# Patient Record
Sex: Male | Born: 1990
Health system: Southern US, Community
[De-identification: ages and names within clinical notes are randomized; demographics above are authoritative.]

## PROBLEM LIST (undated history)

## (undated) DIAGNOSIS — I10 Essential (primary) hypertension: Principal | ICD-10-CM

## (undated) DIAGNOSIS — G4733 Obstructive sleep apnea (adult) (pediatric): Secondary | ICD-10-CM

## (undated) HISTORY — PX: TONSILLECTOMY: SUR1361

## (undated) HISTORY — DX: Obstructive sleep apnea (adult) (pediatric): G47.33

## (undated) HISTORY — DX: Essential (primary) hypertension: I10

---

## 2011-11-18 ENCOUNTER — Emergency Department (HOSPITAL_BASED_OUTPATIENT_CLINIC_OR_DEPARTMENT_OTHER)
Admission: EM | Admit: 2011-11-18 | Discharge: 2011-11-18 | Disposition: A | Discharge status: Home / Self Care | Payer: BC Managed Care – PPO | Attending: Emergency Medicine | Admitting: Emergency Medicine

## 2011-11-18 ENCOUNTER — Encounter (HOSPITAL_BASED_OUTPATIENT_CLINIC_OR_DEPARTMENT_OTHER): Payer: Self-pay | Admitting: *Deleted

## 2011-11-18 ENCOUNTER — Emergency Department (INDEPENDENT_AMBULATORY_CARE_PROVIDER_SITE_OTHER): Payer: BC Managed Care – PPO

## 2011-11-18 DIAGNOSIS — M25469 Effusion, unspecified knee: Secondary | ICD-10-CM

## 2011-11-18 DIAGNOSIS — I10 Essential (primary) hypertension: Secondary | ICD-10-CM | POA: Insufficient documentation

## 2011-11-18 DIAGNOSIS — W19XXXA Unspecified fall, initial encounter: Secondary | ICD-10-CM

## 2011-11-18 DIAGNOSIS — M25569 Pain in unspecified knee: Secondary | ICD-10-CM | POA: Insufficient documentation

## 2011-11-18 DIAGNOSIS — M25461 Effusion, right knee: Secondary | ICD-10-CM

## 2011-11-18 MED ORDER — IBUPROFEN 600 MG PO TABS: 600.0000 mg | ORAL_TABLET | Freq: Four times a day (QID) | ORAL | Status: AC | PRN

## 2011-11-18 MED ORDER — OXYCODONE-ACETAMINOPHEN 5-325 MG PO TABS
1.0000 | ORAL_TABLET | Freq: Once | ORAL | Status: AC
  Administered 2011-11-18: 1 via ORAL
  Filled 2011-11-18: qty 1

## 2011-11-18 MED ORDER — IBUPROFEN 800 MG PO TABS
800.0000 mg | ORAL_TABLET | Freq: Once | ORAL | Status: AC
  Administered 2011-11-18: 800 mg via ORAL
  Filled 2011-11-18: qty 1

## 2011-11-18 MED ORDER — OXYCODONE-ACETAMINOPHEN 5-325 MG PO TABS: 1.0000 | ORAL_TABLET | ORAL | Status: DC | PRN

## 2011-11-18 NOTE — ED Provider Notes (Signed)
History   This chart was scribed for Joya Gaskins, MD by Charolett Bumpers . The patient was seen in room MH07/MH07 and the patient's care was started at 10:16pm.    CSN: [id number]  Arrival date & time 11/18/11  1959   First MD Initiated Contact with Patient 11/18/11 2208      Chief Complaint  Patient presents with  . Knee Pain    HPI Roger Salinas is a 21 y.o. male who presents to the Emergency Department complaining of constant, moderate right knee pain that started after a fall that occurred yesterday. Patient states that he fell getting out of a shower. Patient reports hearing a popping noise upon falling on his knee. Patient also reports associated swelling of right knee. Patient denies fever and vomiting. Patient states that he is unable to bear weight on his right knee. Patient reports a h/o a similar problem, but did not have surgery on his right knee. Patient denies h/o other medical problems. No other symptoms reported.   PCP: [facility]  PMH - none  Past Surgical History  Procedure Date  . Tonsillectomy     History reviewed. No pertinent family history.  History  Substance Use Topics  . Smoking status: Current Everyday Smoker  . Smokeless tobacco: Not on file  . Alcohol Use: No      Review of Systems  Constitutional: Negative for fever.  Gastrointestinal: Negative for vomiting.     Allergies  Review of patient's allergies indicates no known allergies.  Home Medications   Current Outpatient Rx  Name Route Sig Dispense Refill  . IBUPROFEN 200 MG PO TABS Oral Take 800 mg by mouth 3 (three) times daily as needed. For pain      BP 162/108  Pulse 108  Temp(Src) 98.4 F (36.9 C) (Oral)  Resp 18  SpO2 96%  Physical Exam CONSTITUTIONAL: Well developed/well nourished HEAD AND FACE: Normocephalic/atraumatic EYES: EOMI/PERRL ENMT: Mucous membranes moist NECK: supple no meningeal signs SPINE:entire spine nontender CV: S1/S2 noted, no  murmurs/rubs/gallops noted LUNGS: Lungs are clear to auscultation bilaterally, no apparent distress ABDOMEN: soft, nontender, no rebound or guarding NEURO: Pt is awake/alert, moves all extremitiesx4 EXTREMITIES: pulses normal, full ROM. Diffuse tenderness of right knee with swelling noted. Limitations in flexion of right knee. No deformity.  All other extremities/joints palpated/ranged and nontender SKIN: warm, color normal PSYCH: no abnormalities of mood noted   ED Course  Procedures  DIAGNOSTIC STUDIES: Oxygen Saturation is 96% on room air, adequate by my interpretation.    COORDINATION OF CARE:  2219: Discussed planned course of treatment with the patient who was agreeable at this time. Discussed f/u.  2230: Medication Orders: Ibuprofen tablet 800 mg-once; Oxycodone-acetaminophen 5-325 mg per tablet 1 tablet-once   Labs Reviewed - No data to display Dg Knee Complete 4 Views Right  11/18/2011  *RADIOLOGY REPORT*  Clinical Data: Fall.  Knee pain.  RIGHT KNEE - COMPLETE 4+ VIEW  Comparison: None.  Findings: Suprapatellar joint effusion.  Minimal irregularity along the inferior aspect of the patella without discrete fracture identified.  IMPRESSION: Suprapatellar joint effusion.  Minimal irregularity along the inferior aspect of the patella without discrete fracture identified.  Original Report Authenticated By: Fuller Canada, M.D.    Pt advised NWB, crutches and close followup   MDM  Nursing notes reviewed and considered in documentation xrays reviewed and considered    I personally performed the services described in this documentation, which was scribed in my presence. The recorded  information has been reviewed and considered.         Joya Gaskins, MD 11/18/11 2337

## 2011-11-18 NOTE — ED Notes (Signed)
Pt states that he fell yesterday and heard and felt his right knee pop and felt it crack presents with significant swelling to that knee

## 2011-11-18 NOTE — Discharge Instructions (Signed)
KEEP LEG ELEVATED WHEN RESTING USE CRUTCHES UNTIL SEEN BY SPECIALIST RETURN FOR WORSENED PAIN OR SWELLING IN THAT LEG  Knee Effusion The medical term for having fluid in your knee is effusion. This is often due to an internal derangement of the knee. This means something is wrong inside the knee. Some of the causes of fluid in the knee may be torn cartilage, a torn ligament, or bleeding into the joint from an injury. Your knee is likely more difficult to bend and move. This is often because there is increased pain and pressure in the joint. The time it takes for recovery from a knee effusion depends on different factors, including:   Type of injury.   Your age.   Physical and medical conditions.   Rehabilitation Strategies.  How long you will be away from your normal activities will depend on what kind of knee problem you have and how much damage is present. Your knee has two types of cartilage. Articular cartilage covers the bone ends and lets your knee bend and move smoothly. Two menisci, thick pads of cartilage that form a rim inside the joint, help absorb shock and stabilize your knee. Ligaments bind the bones together and support your knee joint. Muscles move the joint, help support your knee, and take stress off the joint itself. CAUSES  Often an effusion in the knee is caused by an injury to one of the menisci. This is often a tear in the cartilage. Recovery after a meniscus injury depends on how much meniscus is damaged and whether you have damaged other knee tissue. Small tears may heal on their own with conservative treatment. Conservative means rest, limited weight bearing activity and muscle strengthening exercises. Your recovery may take up to 6 weeks.  TREATMENT  Larger tears may require surgery. Meniscus injuries may be treated during arthroscopy. Arthroscopy is a procedure in which your surgeon uses a small telescope like instrument to look in your knee. Your caregiver can make a  more accurate diagnosis (learning what is wrong) by performing an arthroscopic procedure. If your injury is on the inner margin of the meniscus, your surgeon may trim the meniscus back to a smooth rim. In other cases your surgeon will try to repair a damaged meniscus with stitches (sutures). This may make rehabilitation take longer, but may provide better long term result by helping your knee keep its shock absorption capabilities. Ligaments which are completely torn usually require surgery for repair. HOME CARE INSTRUCTIONS  Use crutches as instructed.   If a brace is applied, use as directed.   Once you are home, an ice pack applied to your swollen knee may help with discomfort and help decrease swelling.   Keep your knee raised (elevated) when you are not up and around or on crutches.   Only take over-the-counter or prescription medicines for pain, discomfort, or fever as directed by your caregiver.   Your caregivers will help with instructions for rehabilitation of your knee. This often includes strengthening exercises.   You may resume a normal diet and activities as directed.  SEEK MEDICAL CARE IF:   There is increased swelling in your knee.   You notice redness, swelling, or increasing pain in your knee.   An unexplained oral temperature above 102 F (38.9 C) develops.  SEEK IMMEDIATE MEDICAL CARE IF:   You develop a rash.   You have difficulty breathing.   You have any allergic reactions from medications you may have been given.  There is severe pain with any motion of the knee.  MAKE SURE YOU:   Understand these instructions.   Will watch your condition.   Will get help right away if you are not doing well or get worse.  Document Released: 10/20/2003 Document Revised: 07/19/2011 Document Reviewed: 12/24/2007 [facility] Patient Information 2012 [place], [place].

## 2011-11-18 NOTE — ED Notes (Signed)
rx x 1 for percocet given to pt- rx for ibuprofen was e-prescribed, pt is aware- d/c home with a ride

## 2011-11-21 ENCOUNTER — Ambulatory Visit (INDEPENDENT_AMBULATORY_CARE_PROVIDER_SITE_OTHER): Payer: BC Managed Care – PPO | Admitting: Family Medicine

## 2011-11-21 ENCOUNTER — Encounter: Payer: Self-pay | Admitting: Family Medicine

## 2011-11-21 ENCOUNTER — Ambulatory Visit: Payer: BC Managed Care – PPO | Admitting: Family Medicine

## 2011-11-21 VITALS — BP 149/102 | HR 92 | Temp 98.1°F | Ht 72.0 in | Wt 280.0 lb

## 2011-11-21 DIAGNOSIS — S99929A Unspecified injury of unspecified foot, initial encounter: Secondary | ICD-10-CM

## 2011-11-21 DIAGNOSIS — S8991XA Unspecified injury of right lower leg, initial encounter: Secondary | ICD-10-CM

## 2011-11-21 DIAGNOSIS — S8990XA Unspecified injury of unspecified lower leg, initial encounter: Secondary | ICD-10-CM

## 2011-11-21 NOTE — Patient Instructions (Signed)
Your exam and history are concerning for a medial meniscal tear and possible ACL tear. As you stated, this was a concern a few years ago and the MRI came back normal. I would recommend trying conservative treatment first. Ice knee 15 minutes at a time 3-4 times a day. Elevate above the level of your heart as much as possible. Quad sets and straight leg raises 3 sets of 10 once a day to keep quad strength. ACE wrap as directed to help with swelling. Aleve 1-2 tabs twice a day with food for pain and inflammation. Take pain medication as directed - no driving or working while on this medication. Follow up with me in 2-3 weeks. Out of work in the meantime.

## 2011-11-22 ENCOUNTER — Encounter: Payer: Self-pay | Admitting: Family Medicine

## 2011-11-22 DIAGNOSIS — S8991XA Unspecified injury of right lower leg, initial encounter: Secondary | ICD-10-CM | POA: Insufficient documentation

## 2011-11-22 MED ORDER — OXYCODONE-ACETAMINOPHEN 5-325 MG PO TABS: 1.0000 | ORAL_TABLET | Freq: Four times a day (QID) | ORAL | Status: AC | PRN

## 2011-11-22 NOTE — Progress Notes (Signed)
Subjective:    Patient ID: Roger Salinas, male    DOB: August 19, 1990, 21 y.o.   MRN: [id number]  PCP: None  HPI 21 yo M here for right knee injury.  Patient reports on 4/6 he was in shower when he slipped and fell down directly onto right knee after suffering valgus injury to this. States he heard a snap and a crack at the time. Was followed by swelling and bruising on inside of knee. Able to ambulate following this. Does feel improved since then. Went to ED - had x-rays without obvious fracture - placed in immobilizer and has been using crutches as well. Of note 2-3 years ago suffered a similar injury to right knee - had knee aspirated and MRI done that was negative for meniscal and ligament pathology.  He completely improved from this injury. Has been taking ibuprofen and pain medicine given to him by ED. Able to fully extend - has been in immobilizer so unsure about giving out, catching.  History reviewed. No pertinent past medical history.  Current Outpatient Prescriptions on File Prior to Visit  Medication Sig Dispense Refill  . ibuprofen (ADVIL,MOTRIN) 600 MG tablet Take 1 tablet (600 mg total) by mouth every 6 (six) hours as needed for pain.  30 tablet  0  . oxyCODONE-acetaminophen (PERCOCET) 5-325 MG per tablet Take 1 tablet by mouth every 4 (four) hours as needed for pain.  15 tablet  0    Past Surgical History  Procedure Date  . Tonsillectomy     No Known Allergies  History   Social History  . Marital Status: Single    Spouse Name: N/A    Number of Children: N/A  . Years of Education: N/A   Occupational History  . Not on file.   Social History Main Topics  . Smoking status: Current Everyday Smoker -- 1.0 packs/day    Types: Cigarettes  . Smokeless tobacco: Not on file  . Alcohol Use: No  . Drug Use: No  . Sexually Active: Not on file   Other Topics Concern  . Not on file   Social History Narrative  . No narrative on file    Family History  Problem  Relation Age of Onset  . Hypertension Mother   . Diabetes Neg Hx   . Heart attack Neg Hx   . Hyperlipidemia Neg Hx   . Sudden death Neg Hx     BP 149/102  Pulse 92  Temp(Src) 98.1 F (36.7 C) (Oral)  Ht 6' (1.829 m)  Wt 280 lb (127.007 kg)  BMI 37.97 kg/m2  Review of Systems See HPI above.    Objective:   Physical Exam Gen: NAD  R knee: Mod-large joint effusion.  Ecchymoses medially.  No erythema. TTP medial joint line.  No lateral joint line, patellar tendon, patella, or other TTP about knee. ROM 0 - 90 degrees. 1+ ant drawer and lachmanns.  Neg post drawer.  Negative valgus/varus testing.  Positive mcmurrays and apleys medially.  Pain with apprehension but does not feel as if patella will dislocate.   NV intact distally.  L knee: FROM without swelling, instability.    Assessment & Plan:  1. Right knee injury - history and exam concerning for medial meniscus tear and ACL tear.  Of note he had a similar injury with similar presentation 2-3 years ago and had a normal MRI, improved from this.  He would like to try conservative therapy first which I think is reasonable given his history,  lack of mechanical symptoms, and that operative intervention with ACL pathology typically is delayed a few weeks to decrease the risk of arthrofibrosis.  Start quad strengthening.  Icing, ace wrap, ibuprofen.  Rx percocet 5/325 q6h prn #60 today with no refills.  Offered aspiration but he declined - can consider in future to help with pain.  F/u in 2-3 weeks.  Out of work in the meantime due to being on pain medication, job requires lots of standing and ambulation.  See instructions for further.

## 2011-11-22 NOTE — Assessment & Plan Note (Signed)
history and exam concerning for medial meniscus tear and ACL tear.  Of note he had a similar injury with similar presentation 2-3 years ago and had a normal MRI, improved from this.  He would like to try conservative therapy first which I think is reasonable given his history, lack of mechanical symptoms, and that operative intervention with ACL pathology typically is delayed a few weeks to decrease the risk of arthrofibrosis.  Start quad strengthening.  Icing, ace wrap, ibuprofen.  Rx percocet 5/325 q6h prn #60 today with no refills.  Offered aspiration but he declined - can consider in future to help with pain.  F/u in 2-3 weeks.  Out of work in the meantime due to being on pain medication, job requires lots of standing and ambulation.  See instructions for further.

## 2011-12-05 ENCOUNTER — Encounter: Payer: Self-pay | Admitting: Family Medicine

## 2011-12-05 ENCOUNTER — Ambulatory Visit (INDEPENDENT_AMBULATORY_CARE_PROVIDER_SITE_OTHER): Payer: BC Managed Care – PPO | Admitting: Family Medicine

## 2011-12-05 VITALS — BP 130/88 | HR 96 | Temp 97.9°F | Ht 72.0 in | Wt 280.0 lb

## 2011-12-05 DIAGNOSIS — S8991XA Unspecified injury of right lower leg, initial encounter: Secondary | ICD-10-CM

## 2011-12-05 DIAGNOSIS — S8990XA Unspecified injury of unspecified lower leg, initial encounter: Secondary | ICD-10-CM

## 2011-12-05 MED ORDER — TRAMADOL HCL 50 MG PO TABS: 50.0000 mg | ORAL_TABLET | Freq: Three times a day (TID) | ORAL | Status: DC | PRN

## 2011-12-05 NOTE — Patient Instructions (Addendum)
Continue with your home exercises, icing, ibuprofen, ACE wrap. Take tramadol as needed for severe pain - I've sent this to the [facility] on [address]. Considerations moving forward: physical therapy, MRI, cortisone injection. Return to work on light duty: stand/walk only 10 minutes each hour, no stooping, crawling, climbing. Follow up with me in 4 weeks for reevaluation.

## 2011-12-05 NOTE — Progress Notes (Signed)
Subjective:    Patient ID: Roger Salinas, male    DOB: 1990-08-14, 21 y.o.   MRN: [id number]  PCP: None  HPI  21 yo M here for f/u right knee injury.  4/10: Patient reports on 4/6 he was in shower when he slipped and fell down directly onto right knee after suffering valgus injury to this. States he heard a snap and a crack at the time. Was followed by swelling and bruising on inside of knee. Able to ambulate following this. Does feel improved since then. Went to ED - had x-rays without obvious fracture - placed in immobilizer and has been using crutches as well. Of note 2-3 years ago suffered a similar injury to right knee - had knee aspirated and MRI done that was negative for meniscal and ligament pathology.  He completely improved from this injury. Has been taking ibuprofen and pain medicine given to him by ED. Able to fully extend - has been in immobilizer so unsure about giving out, catching.  4/24: Patient reports he's had improvement since last visit. Is doing home exercises regularly. Taking ibuprofen with percocet as needed. Has been icing knee and wearing ACE wrap as well. He would like to return to work if possible. He would like to also continue with conservative care. No catching, locking of knee.  History reviewed. No pertinent past medical history.  No current outpatient prescriptions on file prior to visit.    Past Surgical History  Procedure Date  . Tonsillectomy     No Known Allergies  History   Social History  . Marital Status: Single    Spouse Name: N/A    Number of Children: N/A  . Years of Education: N/A   Occupational History  . Not on file.   Social History Main Topics  . Smoking status: Current Everyday Smoker -- 1.0 packs/day    Types: Cigarettes  . Smokeless tobacco: Not on file  . Alcohol Use: No  . Drug Use: No  . Sexually Active: Not on file   Other Topics Concern  . Not on file   Social History Narrative  . No  narrative on file    Family History  Problem Relation Age of Onset  . Hypertension Mother   . Diabetes Neg Hx   . Heart attack Neg Hx   . Hyperlipidemia Neg Hx   . Sudden death Neg Hx     BP 130/88  Pulse 96  Temp(Src) 97.9 F (36.6 C) (Oral)  Ht 6' (1.829 m)  Wt 280 lb (127.007 kg)  BMI 37.97 kg/m2  Review of Systems  See HPI above.    Objective:   Physical Exam  Gen: NAD  R knee: Mod effusion.  Ecchymoses resolved.  No erythema. TTP medial joint line.  No lateral joint line, patellar tendon, patella, or other TTP about knee. ROM 0 - 100 degrees. Negative ant drawer and lachmanns.  Neg post drawer.  Negative valgus/varus testing.  Positive mcmurrays and apleys medially.  Pain with apprehension but does not feel as if patella will dislocate.   NV intact distally.  L knee: FROM without swelling, instability.    Assessment & Plan:  1. Right knee injury - ACL feels stable on today's exam - doubt injury to this though we discussed large effusions have a high incidence of ACL tears and exam can be falsely negative for these with hamstring guarding.  Still believe he has a medial meniscus tear.  He would like to continue with  conservative care though, exercises, icing, medications.  Back down to tramadol instead of percocet.  Return to work on light duty - note provided.  See instructions for further.  We discussed possibilities of physical therapy, aspiration, injection, further imaging and he declined these at this time.

## 2011-12-06 NOTE — Assessment & Plan Note (Signed)
ACL feels stable on today's exam - doubt injury to this though we discussed large effusions have a high incidence of ACL tears and exam can be falsely negative for these with hamstring guarding.  Still believe he has a medial meniscus tear.  He would like to continue with conservative care though, exercises, icing, medications.  Back down to tramadol instead of percocet.  Return to work on light duty - note provided.  See instructions for further.  We discussed possibilities of physical therapy, aspiration, injection, further imaging and he declined these at this time.

## 2012-01-02 ENCOUNTER — Ambulatory Visit (INDEPENDENT_AMBULATORY_CARE_PROVIDER_SITE_OTHER): Payer: BC Managed Care – PPO | Admitting: Family Medicine

## 2012-01-02 ENCOUNTER — Encounter: Payer: Self-pay | Admitting: Family Medicine

## 2012-01-02 VITALS — BP 152/100 | Ht 72.0 in | Wt 280.0 lb

## 2012-01-02 DIAGNOSIS — S8991XA Unspecified injury of right lower leg, initial encounter: Secondary | ICD-10-CM

## 2012-01-02 DIAGNOSIS — S8990XA Unspecified injury of unspecified lower leg, initial encounter: Secondary | ICD-10-CM

## 2012-01-03 ENCOUNTER — Encounter: Payer: Self-pay | Admitting: Family Medicine

## 2012-01-03 NOTE — Progress Notes (Signed)
  Subjective:    Patient ID: Roger Salinas, male    DOB: 08-20-1990, 21 y.o.   MRN: [id number]  PCP: None  HPI  21 yo M here for f/u right knee injury.  4/10: Patient reports on 4/6 he was in shower when he slipped and fell down directly onto right knee after suffering valgus injury to this. States he heard a snap and a crack at the time. Was followed by swelling and bruising on inside of knee. Able to ambulate following this. Does feel improved since then. Went to ED - had x-rays without obvious fracture - placed in immobilizer and has been using crutches as well. Of note 2-3 years ago suffered a similar injury to right knee - had knee aspirated and MRI done that was negative for meniscal and ligament pathology.  He completely improved from this injury. Has been taking ibuprofen and pain medicine given to him by ED. Able to fully extend - has been in immobilizer so unsure about giving out, catching.  4/24: Patient reports he's had improvement since last visit. Is doing home exercises regularly. Taking ibuprofen with percocet as needed. Has been icing knee and wearing ACE wrap as well. He would like to return to work if possible. He would like to also continue with conservative care. No catching, locking of knee.  5/22: Patient reports complete improvement. No catching, locking, giving out. No pain currently. Ready to return to work without restrictions. No complaints. History reviewed. No pertinent past medical history.  No current outpatient prescriptions on file prior to visit.    Past Surgical History  Procedure Date  . Tonsillectomy     No Known Allergies  History   Social History  . Marital Status: Single    Spouse Name: N/A    Number of Children: N/A  . Years of Education: N/A   Occupational History  . Not on file.   Social History Main Topics  . Smoking status: Current Everyday Smoker -- 1.0 packs/day    Types: Cigarettes  . Smokeless tobacco: Not  on file  . Alcohol Use: No  . Drug Use: No  . Sexually Active: Not on file   Other Topics Concern  . Not on file   Social History Narrative  . No narrative on file    Family History  Problem Relation Age of Onset  . Hypertension Mother   . Diabetes Neg Hx   . Heart attack Neg Hx   . Hyperlipidemia Neg Hx   . Sudden death Neg Hx     BP 152/100  Ht 6' (1.829 m)  Wt 280 lb (127.007 kg)  BMI 37.97 kg/m2  Review of Systems  See HPI above.    Objective:   Physical Exam  Gen: NAD  R knee: No gross deformity, ecchymoses, swelling. No TTP. FROM. Negative ant/post drawers. Negative valgus/varus testing. Negative lachmanns. Negative mcmurrays, apleys, patellar apprehension, clarkes. NV intact distally.  L knee: FROM without swelling, instability.    Assessment & Plan:  1. Right knee injury - clinically improved now without complaints, mechanical symptoms.  ACL stable.  Believe his injury and pain were due to a small medial meniscal tear that is now asymptomatic.  Continue with home exercises.  Return to work full duty without restrictions.  Icing, tylenol, nsaids as needed.  F/u prn.

## 2012-01-03 NOTE — Assessment & Plan Note (Signed)
clinically improved now without complaints, mechanical symptoms.  ACL stable.  Believe his injury and pain were due to a small medial meniscal tear that is now asymptomatic.  Continue with home exercises.  Return to work full duty without restrictions.  Icing, tylenol, nsaids as needed.  F/u prn.

## 2012-07-14 ENCOUNTER — Emergency Department
Admission: EM | Admit: 2012-07-14 | Discharge: 2012-07-14 | Disposition: A | Discharge status: Home / Self Care | Payer: BC Managed Care – PPO | Source: Home / Self Care | Attending: Family Medicine | Admitting: Family Medicine

## 2012-07-14 ENCOUNTER — Encounter: Payer: Self-pay | Admitting: Emergency Medicine

## 2012-07-14 ENCOUNTER — Emergency Department (INDEPENDENT_AMBULATORY_CARE_PROVIDER_SITE_OTHER): Payer: BC Managed Care – PPO

## 2012-07-14 DIAGNOSIS — M25469 Effusion, unspecified knee: Secondary | ICD-10-CM

## 2012-07-14 DIAGNOSIS — S8990XA Unspecified injury of unspecified lower leg, initial encounter: Secondary | ICD-10-CM

## 2012-07-14 DIAGNOSIS — S8991XA Unspecified injury of right lower leg, initial encounter: Secondary | ICD-10-CM

## 2012-07-14 DIAGNOSIS — M25569 Pain in unspecified knee: Secondary | ICD-10-CM

## 2012-07-14 DIAGNOSIS — X500XXA Overexertion from strenuous movement or load, initial encounter: Secondary | ICD-10-CM

## 2012-07-14 NOTE — ED Provider Notes (Signed)
History     CSN: [id number]  Arrival date & time 07/14/12  4098   First MD Initiated Contact with Patient 07/14/12 1007      Chief Complaint  Patient presents with  . Knee Injury      HPI Comments: Patient tripped over furniture this morning, twisting his right knee.  He felt two "popping" sensations, followed by swelling.  Knee occasionally feels as if it may give way or lock.  About 8 months ago he had a mild right meniscal tear that healed with conservative treatment.  Patient is a 21 y.o. male presenting with knee pain. The history is provided by the patient.  Knee Pain This is a new problem. The current episode started 1 to 2 hours ago. The problem occurs constantly. The problem has not changed since onset.Associated symptoms comments: none. The symptoms are aggravated by walking and bending. Nothing relieves the symptoms. He has tried nothing for the symptoms.    History reviewed. No pertinent past medical history.  Past Surgical History  Procedure Date  . Tonsillectomy     Family History  Problem Relation Age of Onset  . Hypertension Mother   . Diabetes Neg Hx   . Heart attack Neg Hx   . Hyperlipidemia Neg Hx   . Sudden death Neg Hx     History  Substance Use Topics  . Smoking status: Current Every Day Smoker -- 1.0 packs/day for 10 years    Types: Cigarettes  . Smokeless tobacco: Not on file  . Alcohol Use: Yes      Review of Systems  All other systems reviewed and are negative.    Allergies  Review of patient's allergies indicates not on file.  Home Medications  No current outpatient prescriptions on file.  BP 146/95  Pulse 102  Temp 98 F (36.7 C) (Oral)  Resp 20  Ht 6' (1.829 m)  Wt 321 lb (145.605 kg)  BMI 43.54 kg/m2  SpO2 96%  Physical Exam  Nursing note and vitals reviewed. Constitutional: He is oriented to person, place, and time. He appears well-developed and well-nourished. No distress.       Patient is obese (BMI 43.5)      HENT:  Head: Atraumatic.  Eyes: Conjunctivae normal are normal. Pupils are equal, round, and reactive to light.  Musculoskeletal: He exhibits tenderness.       Right knee: He exhibits decreased range of motion, swelling and effusion. He exhibits no ecchymosis, no deformity, no laceration, no erythema, normal alignment, no LCL laxity, normal patellar mobility, no bony tenderness, normal meniscus and no MCL laxity. tenderness found. No medial joint line, no lateral joint line, no MCL, no LCL and no patellar tendon tenderness noted.       Right knee has decreased range of motion to full flexion.  There is tenderness along the medial edge of patella.  Negative McMurray test.  Knee stable.  Distal Neurovascular function is intact.   Neurological: He is alert and oriented to person, place, and time.  Skin: Skin is warm and dry.    ED Course  Procedures  none   Dg Knee Complete 4 Views Right  07/14/2012  *RADIOLOGY REPORT*  Clinical Data: Fell twisting knee with pain and swelling  RIGHT KNEE - COMPLETE 4+ VIEW  Comparison: Right knee films of 11/18/2011  Findings: No acute fracture is seen.  There does appear to be a small knee joint effusion present.  Joint spaces are relatively well preserved.  IMPRESSION: No  fracture.  Small right knee joint effusion.   Original Report Authenticated By: Dwyane Dee, M.D.      1. Right knee injury; ? Patellar subluxation; ? Meniscus injury      MDM   Ace wrap applied. Wear ace wrap until swelling decreases.  Resume using knee immobilizer.  Apply ice pack for 30 to 45 minutes every 1 to 4 hours.  Continue until swelling decreases.  Take Ibuprofen 200mg , 4 tabs every 8 hours with food.  Begin knee exercises. Followup with Dr. Rodney Langton in one week.        Lattie Haw, MD 07/14/12 [phone]

## 2012-07-14 NOTE — ED Notes (Signed)
Rt knee injury, this morning from fall, heard several pops, hx of torn meniscus

## 2014-11-02 ENCOUNTER — Emergency Department (HOSPITAL_BASED_OUTPATIENT_CLINIC_OR_DEPARTMENT_OTHER)
Admission: EM | Admit: 2014-11-02 | Discharge: 2014-11-02 | Disposition: A | Discharge status: Home / Self Care | Payer: Self-pay | Attending: Emergency Medicine | Admitting: Emergency Medicine

## 2014-11-02 ENCOUNTER — Encounter (HOSPITAL_BASED_OUTPATIENT_CLINIC_OR_DEPARTMENT_OTHER): Payer: Self-pay | Admitting: *Deleted

## 2014-11-02 ENCOUNTER — Emergency Department (HOSPITAL_BASED_OUTPATIENT_CLINIC_OR_DEPARTMENT_OTHER): Payer: Self-pay

## 2014-11-02 DIAGNOSIS — R000 Tachycardia, unspecified: Secondary | ICD-10-CM | POA: Insufficient documentation

## 2014-11-02 DIAGNOSIS — R0602 Shortness of breath: Secondary | ICD-10-CM | POA: Insufficient documentation

## 2014-11-02 DIAGNOSIS — R030 Elevated blood-pressure reading, without diagnosis of hypertension: Secondary | ICD-10-CM | POA: Insufficient documentation

## 2014-11-02 DIAGNOSIS — R079 Chest pain, unspecified: Secondary | ICD-10-CM | POA: Insufficient documentation

## 2014-11-02 DIAGNOSIS — Z720 Tobacco use: Secondary | ICD-10-CM | POA: Insufficient documentation

## 2014-11-02 DIAGNOSIS — G5711 Meralgia paresthetica, right lower limb: Secondary | ICD-10-CM | POA: Insufficient documentation

## 2014-11-02 DIAGNOSIS — IMO0001 Reserved for inherently not codable concepts without codable children: Secondary | ICD-10-CM

## 2014-11-02 LAB — CBC
HCT: 41.6 % (ref 39.0–52.0)
Hemoglobin: 13.9 g/dL (ref 13.0–17.0)
MCH: 27.2 pg (ref 26.0–34.0)
MCHC: 33.4 g/dL (ref 30.0–36.0)
MCV: 81.4 fL (ref 78.0–100.0)
Platelets: 254 10*3/uL (ref 150–400)
RBC: 5.11 MIL/uL (ref 4.22–5.81)
RDW: 13.8 % (ref 11.5–15.5)
WBC: 10.6 10*3/uL — ABNORMAL HIGH (ref 4.0–10.5)

## 2014-11-02 LAB — BASIC METABOLIC PANEL
Anion gap: 8 (ref 5–15)
BUN: 16 mg/dL (ref 6–23)
CO2: 28 mmol/L (ref 19–32)
Calcium: 9.1 mg/dL (ref 8.4–10.5)
Chloride: 102 mmol/L (ref 96–112)
Creatinine, Ser: 1 mg/dL (ref 0.50–1.35)
GFR calc Af Amer: >90 mL/min (ref >90)
GFR calc non Af Amer: >90 mL/min (ref >90)
Glucose, Bld: 120 mg/dL — ABNORMAL HIGH (ref 70–99)
Potassium: 3.7 mmol/L (ref 3.5–5.1)
Sodium: 138 mmol/L (ref 135–145)

## 2014-11-02 LAB — D-DIMER, QUANTITATIVE: D-Dimer, Quant: 0.34 ug/mL-FEU (ref 0.00–0.48)

## 2014-11-02 LAB — TROPONIN I: Troponin I: 0.03 ng/mL (ref <0.031)

## 2014-11-02 LAB — BRAIN NATRIURETIC PEPTIDE: B Natriuretic Peptide: 42.3 pg/mL (ref 0.0–100.0)

## 2014-11-02 MED ORDER — METOPROLOL TARTRATE 25 MG PO TABS: 50.0000 mg | ORAL_TABLET | Freq: Two times a day (BID) | ORAL | Status: DC

## 2014-11-02 NOTE — ED Notes (Signed)
Pt c/o numbness to his lateral right thigh since yesterday. Pt ambulating unassisted with a quick, steady gait.

## 2014-11-02 NOTE — ED Provider Notes (Signed)
CSN: [id number]     Arrival date & time 11/02/14  1600 History   First MD Initiated Contact with Patient 11/02/14 1610     Chief Complaint  Patient presents with  . Numbness  . Shortness of Breath     (Consider location/radiation/quality/duration/timing/severity/associated sxs/prior Treatment) HPI Comments: 24 y/o morbidly obese M c/o numbness to right lateral thigh that he first noticed yesterday afternoon while at work. Denies pain, however has a tingling sensation when standing for a long period of time. Denies radiating pain. No back pain or prior injury. Denies extremity weakness. Denies exerting himself more than normal. Pt also endorses SOB on 2-3 occasions today described as needing to take in one deep breath then feel better, no previous history of the same. Admits to chest pain with inspiration along with palpation described as a "bruised" feeling in midsternum. No previous h/o DVTs, numbness/tingling, heart problems or diabetes. No recent travel or surgeries. No family hx of early heart disease. No home remedies. Denies back pain, numbness/tingling in hands/feet, lightheaded/dizziness, and blurred vision.   Patient is a 24 y.o. male presenting with shortness of breath. The history is provided by the patient.  Shortness of Breath Associated symptoms: chest pain     History reviewed. No pertinent past medical history. Past Surgical History  Procedure Laterality Date  . Tonsillectomy     Family History  Problem Relation Age of Onset  . Hypertension Mother   . Diabetes Neg Hx   . Heart attack Neg Hx   . Hyperlipidemia Neg Hx   . Sudden death Neg Hx    History  Substance Use Topics  . Smoking status: Current Every Day Smoker -- 1.00 packs/day for 10 years    Types: Cigarettes  . Smokeless tobacco: Not on file  . Alcohol Use: Yes    Review of Systems  Respiratory: Positive for shortness of breath.   Cardiovascular: Positive for chest pain.  Neurological: Positive for  numbness.  All other systems reviewed and are negative.     Allergies  Review of patient's allergies indicates no known allergies.  Home Medications   Prior to Admission medications   Medication Sig Start Date End Date Taking? Authorizing Provider  metoprolol (LOPRESSOR) 25 MG tablet Take 2 tablets (50 mg total) by mouth 2 (two) times daily. 11/02/14   Letia Guidry M Braian Tijerina, PA-C   BP 130/100 mmHg  Pulse 98  Temp(Src) 98.1 F (36.7 C) (Oral)  Resp 20  Ht 6' (1.829 m)  Wt 370 lb (167.831 kg)  BMI 50.17 kg/m2  SpO2 95% Physical Exam  Constitutional: He is oriented to person, place, and time. He appears well-developed and well-nourished. No distress.  Morbidly obese.  HENT:  Head: Normocephalic and atraumatic.  Mouth/Throat: Oropharynx is clear and moist.  Eyes: Conjunctivae and EOM are normal. Pupils are equal, round, and reactive to light.  Neck: Normal range of motion. Neck supple. No JVD present. No spinous process tenderness and no muscular tenderness present.  Cardiovascular: Normal rate, regular rhythm, normal heart sounds and intact distal pulses.   No extremity edema.  Pulmonary/Chest: Effort normal and breath sounds normal. No respiratory distress.    Abdominal: Soft. Bowel sounds are normal. There is no tenderness.  Musculoskeletal: Normal range of motion. He exhibits no edema.       Legs: Decreased sensation to light touch in area circled. No tenderness.  Neurological: He is alert and oriented to person, place, and time. He has normal strength.  Speech fluent, goal  oriented. Moves limbs without ataxia. Equal grip strength bilateral.  Skin: Skin is warm and dry. No rash noted. He is not diaphoretic.  Psychiatric: He has a normal mood and affect. His behavior is normal.  Nursing note and vitals reviewed.   ED Course  Procedures (including critical care time) Labs Review Labs Reviewed  CBC - Abnormal; Notable for the following:    WBC 10.6 (*)    All other  components within normal limits  BASIC METABOLIC PANEL - Abnormal; Notable for the following:    Glucose, Bld 120 (*)    All other components within normal limits  BRAIN NATRIURETIC PEPTIDE  D-DIMER, QUANTITATIVE  TROPONIN I    Imaging Review Dg Chest 2 View  11/02/2014   CLINICAL DATA:  Pleuritic chest pain. Shortness of breath. Duration: 1 day.  EXAM: CHEST  2 VIEW  COMPARISON:  None.  FINDINGS: The lungs appear clear.  Cardiac and mediastinal contours normal.  No pleural effusion identified.  IMPRESSION: No active cardiopulmonary disease.   Electronically Signed   By: Gaylyn Rong M.D.   On: 11/02/2014 17:48     EKG Interpretation   Date/Time:  Tuesday November 02 2014 17:00:54 EDT Ventricular Rate:  99 PR Interval:  158 QRS Duration: 94 QT Interval:  340 QTC Calculation: 436 R Axis:   15 Text Interpretation:  Normal sinus rhythm Normal ECG No old tracing to  compare Confirmed by Mirian Mo [phone]) on 11/02/2014 5:51:29 PM      MDM   Final diagnoses:  Meralgia paresthetica of right side  Shortness of breath  Elevated blood pressure   NAD. Afebrile. Tachycardic and hypertensive. No focal neurologic deficits other than numbness to his right lateral thigh. No associated back issues. Discussed NSAIDs and weight loss for meralgia paresthetica. Doubt CVA. Regarding shortness of breath, could not PERC negative given initial heart rate. Workup negative. Doubt CAD or CHF. Regarding elevated blood pressure, no history of the same. Repeat blood pressure still with diastolic 100. Will start patient on metoprolol, 25 mg twice a day. Resources given for PCP follow-up. Discussed importance of establishing care with a primary care physician. Stable for discharge. Return precautions given. Patient states understanding of treatment care plan and is agreeable.  Kathrynn Speed, PA-C 11/02/14 1824  Mirian Mo, MD 11/06/14 1728

## 2014-11-02 NOTE — Discharge Instructions (Signed)
Take metoprolol twice daily for your blood pressure. It is very important for you to establish care with a primary care doctor as soon as possible. Paresthesia Paresthesia is an abnormal burning or prickling sensation. This sensation is generally felt in the hands, arms, legs, or feet. However, it may occur in any part of the body. It is usually not painful. The feeling may be described as:  Tingling or numbness.  "Pins and needles."  Skin crawling.  Buzzing.  Limbs "falling asleep."  Itching. Most people experience temporary (transient) paresthesia at some time in their lives. CAUSES  Paresthesia may occur when you breathe too quickly (hyperventilation). It can also occur without any apparent cause. Commonly, paresthesia occurs when pressure is placed on a nerve. The feeling quickly goes away once the pressure is removed. For some people, however, paresthesia is a long-lasting (chronic) condition caused by an underlying disorder. The underlying disorder may be:  A traumatic, direct injury to nerves. Examples include a:  Broken (fractured) neck.  Fractured skull.  A disorder affecting the brain and spinal cord (central nervous system). Examples include:  Transverse myelitis.  Encephalitis.  Transient ischemic attack.  Multiple sclerosis.  Stroke.  Tumor or blood vessel problems, such as an arteriovenous malformation pressing against the brain or spinal cord.  A condition that damages the peripheral nerves (peripheral neuropathy). Peripheral nerves are not part of the brain and spinal cord. These conditions include:  Diabetes.  Peripheral vascular disease.  Nerve entrapment syndromes, such as carpal tunnel syndrome.  Shingles.  Hypothyroidism.  Vitamin B12 deficiencies.  Alcoholism.  Heavy metal poisoning (lead, arsenic).  Rheumatoid arthritis.  Systemic lupus erythematosus. DIAGNOSIS  Your caregiver will attempt to find the underlying cause of your  paresthesia. Your caregiver may:  Take your medical history.  Perform a physical exam.  Order various lab tests.  Order imaging tests. TREATMENT  Treatment for paresthesia depends on the underlying cause. HOME CARE INSTRUCTIONS  Avoid drinking alcohol.  You may consider massage or acupuncture to help relieve your symptoms.  Keep all follow-up appointments as directed by your caregiver. SEEK IMMEDIATE MEDICAL CARE IF:   You feel weak.  You have trouble walking or moving.  You have problems with speech or vision.  You feel confused.  You cannot control your bladder or bowel movements.  You feel numbness after an injury.  You faint.  Your burning or prickling feeling gets worse when walking.  You have pain, cramps, or dizziness.  You develop a rash. MAKE SURE YOU:  Understand these instructions.  Will watch your condition.  Will get help right away if you are not doing well or get worse. Document Released: 07/20/2002 Document Revised: 10/22/2011 Document Reviewed: 04/20/2011 [facility] Patient Information 2015 [place], [place]. This information is not intended to replace advice given to you by your health care provider. Make sure you discuss any questions you have with your health care provider.   Shortness of Breath Shortness of breath means you have trouble breathing. It could also mean that you have a medical problem. You should get immediate medical care for shortness of breath. CAUSES   Not enough oxygen in the air such as with high altitudes or a smoke-filled room.  Certain lung diseases, infections, or problems.  Heart disease or conditions, such as angina or heart failure.  Low red blood cells (anemia).  Poor physical fitness, which can cause shortness of breath when you exercise.  Chest or back injuries or stiffness.  Being overweight.  Smoking.  Anxiety, which can make you feel like you are not getting enough air. DIAGNOSIS  Serious medical  problems can often be found during your physical exam. Tests may also be done to determine why you are having shortness of breath. Tests may include:  Chest X-rays.  Lung function tests.  Blood tests.  An electrocardiogram (ECG).  An ambulatory electrocardiogram. An ambulatory ECG records your heartbeat patterns over a 24-hour period.  Exercise testing.  A transthoracic echocardiogram (TTE). During echocardiography, sound waves are used to evaluate how blood flows through your heart.  A transesophageal echocardiogram (TEE).  Imaging scans. Your health care provider may not be able to find a cause for your shortness of breath after your exam. In this case, it is important to have a follow-up exam with your health care provider as directed.  TREATMENT  Treatment for shortness of breath depends on the cause of your symptoms and can vary greatly. HOME CARE INSTRUCTIONS   Do not smoke. Smoking is a common cause of shortness of breath. If you smoke, ask for help to quit.  Avoid being around chemicals or things that may bother your breathing, such as paint fumes and dust.  Rest as needed. Slowly resume your usual activities.  If medicines were prescribed, take them as directed for the full length of time directed. This includes oxygen and any inhaled medicines.  Keep all follow-up appointments as directed by your health care provider. SEEK MEDICAL CARE IF:   Your condition does not improve in the time expected.  You have a hard time doing your normal activities even with rest.  You have any new symptoms. SEEK IMMEDIATE MEDICAL CARE IF:   Your shortness of breath gets worse.  You feel light-headed, faint, or develop a cough not controlled with medicines.  You start coughing up blood.  You have pain with breathing.  You have chest pain or pain in your arms, shoulders, or abdomen.  You have a fever.  You are unable to walk up stairs or exercise the way you normally  do. MAKE SURE YOU:  Understand these instructions.  Will watch your condition.  Will get help right away if you are not doing well or get worse. Document Released: 04/24/2001 Document Revised: 08/04/2013 Document Reviewed: 10/15/2011 [facility] Patient Information 2015 [place], [place]. This information is not intended to replace advice given to you by your health care provider. Make sure you discuss any questions you have with your health care provider.

## 2016-01-03 ENCOUNTER — Encounter (HOSPITAL_BASED_OUTPATIENT_CLINIC_OR_DEPARTMENT_OTHER): Payer: Self-pay | Admitting: *Deleted

## 2016-01-03 ENCOUNTER — Emergency Department (HOSPITAL_BASED_OUTPATIENT_CLINIC_OR_DEPARTMENT_OTHER)
Admission: EM | Admit: 2016-01-03 | Discharge: 2016-01-03 | Disposition: A | Discharge status: Home / Self Care | Payer: BLUE CROSS/BLUE SHIELD | Attending: Emergency Medicine | Admitting: Emergency Medicine

## 2016-01-03 DIAGNOSIS — F17210 Nicotine dependence, cigarettes, uncomplicated: Secondary | ICD-10-CM | POA: Diagnosis not present

## 2016-01-03 DIAGNOSIS — N390 Urinary tract infection, site not specified: Secondary | ICD-10-CM | POA: Insufficient documentation

## 2016-01-03 DIAGNOSIS — R300 Dysuria: Secondary | ICD-10-CM | POA: Diagnosis present

## 2016-01-03 LAB — URINE MICROSCOPIC-ADD ON

## 2016-01-03 LAB — URINALYSIS, ROUTINE W REFLEX MICROSCOPIC
Glucose, UA: NEGATIVE mg/dL
Ketones, ur: 15 mg/dL — AB
Nitrite: NEGATIVE
Protein, ur: 30 mg/dL — AB
Specific Gravity, Urine: 1.027 (ref 1.005–1.030)
pH: 5.5 (ref 5.0–8.0)

## 2016-01-03 MED ORDER — CEPHALEXIN 500 MG PO CAPS: 500.0000 mg | ORAL_CAPSULE | Freq: Three times a day (TID) | ORAL | Status: DC

## 2016-01-03 MED FILL — CEPHALEXIN 500 MG CAPSULE: 500 | 7 days supply | Qty: 21 | Fill #0

## 2016-01-03 NOTE — ED Notes (Signed)
Dysuria and urgency since this am.

## 2016-01-03 NOTE — Discharge Instructions (Signed)
There does not appear to be an emergent cause for your symptoms at this time. Her symptoms are likely due to a urinary tract infection. You'll be treated for this with antibiotics. It is important to follow-up with your PCP at the end of this week or next week to ensure the blood is out of your urine. If there is still blood in your urine, you may need further evaluation by a urologist. Return to ED for any new or worsening symptoms.  Urinary Tract Infection Urinary tract infections (UTIs) can develop anywhere along your urinary tract. Your urinary tract is your body's drainage system for removing wastes and extra water. Your urinary tract includes two kidneys, two ureters, a bladder, and a urethra. Your kidneys are a pair of bean-shaped organs. Each kidney is about the size of your fist. They are located below your ribs, one on each side of your spine. CAUSES Infections are caused by microbes, which are microscopic organisms, including fungi, viruses, and bacteria. These organisms are so small that they can only be seen through a microscope. Bacteria are the microbes that most commonly cause UTIs. SYMPTOMS  Symptoms of UTIs may vary by age and gender of the patient and by the location of the infection. Symptoms in young women typically include a frequent and intense urge to urinate and a painful, burning feeling in the bladder or urethra during urination. Older women and men are more likely to be tired, shaky, and weak and have muscle aches and abdominal pain. A fever may mean the infection is in your kidneys. Other symptoms of a kidney infection include pain in your back or sides below the ribs, nausea, and vomiting. DIAGNOSIS To diagnose a UTI, your caregiver will ask you about your symptoms. Your caregiver will also ask you to provide a urine sample. The urine sample will be tested for bacteria and white blood cells. White blood cells are made by your body to help fight infection. TREATMENT    Typically, UTIs can be treated with medication. Because most UTIs are caused by a bacterial infection, they usually can be treated with the use of antibiotics. The choice of antibiotic and length of treatment depend on your symptoms and the type of bacteria causing your infection. HOME CARE INSTRUCTIONS  If you were prescribed antibiotics, take them exactly as your caregiver instructs you. Finish the medication even if you feel better after you have only taken some of the medication.  Drink enough water and fluids to keep your urine clear or pale yellow.  Avoid caffeine, tea, and carbonated beverages. They tend to irritate your bladder.  Empty your bladder often. Avoid holding urine for long periods of time.  Empty your bladder before and after sexual intercourse.  After a bowel movement, women should cleanse from front to back. Use each tissue only once. SEEK MEDICAL CARE IF:   You have back pain.  You develop a fever.  Your symptoms do not begin to resolve within 3 days. SEEK IMMEDIATE MEDICAL CARE IF:   You have severe back pain or lower abdominal pain.  You develop chills.  You have nausea or vomiting.  You have continued burning or discomfort with urination. MAKE SURE YOU:   Understand these instructions.  Will watch your condition.  Will get help right away if you are not doing well or get worse.   This information is not intended to replace advice given to you by your health care provider. Make sure you discuss any questions you  have with your health care provider.   Document Released: 05/09/2005 Document Revised: 04/20/2015 Document Reviewed: 09/07/2011 [employer]vier Interactive Patient Education [employer].

## 2016-01-03 NOTE — ED Provider Notes (Signed)
CSN: [id number]     Arrival date & time 01/03/16  1339 History   First MD Initiated Contact with Patient 01/03/16 1403     Chief Complaint  Patient presents with  . Dysuria     (Consider location/radiation/quality/duration/timing/severity/associated sxs/prior Treatment) HPI Breyson Kelm is a 25 y.o. male here for evaluation of urinary hesitancy for the past one day-started at 7am. Patient reports he woke up this morning, felt like he needed to pee, "but only could get out through 4 drops" and still feels like he has to pee. He denies any back pain, numbness or weakness, rectal pain, penile pain or discharge, scrotal pain or testicular pain/swelling. Denies any medication use at this time. No recreational drug use, IV drug use. Reports last sexual contact with his wife on Friday, no concern for STI. Denies fevers, chills, abdominal pain, nausea or vomiting. No other alleviating or aggravating factors.  History reviewed. No pertinent past medical history. Past Surgical History  Procedure Laterality Date  . Tonsillectomy     Family History  Problem Relation Age of Onset  . Hypertension Mother   . Diabetes Neg Hx   . Heart attack Neg Hx   . Hyperlipidemia Neg Hx   . Sudden death Neg Hx    Social History  Substance Use Topics  . Smoking status: Current Every Day Smoker -- 1.00 packs/day for 10 years    Types: Cigarettes  . Smokeless tobacco: None  . Alcohol Use: Yes    Review of Systems A 10 point review of systems was completed and was negative except for pertinent positives and negatives as mentioned in the history of present illness     Allergies  Review of patient's allergies indicates no known allergies.  Home Medications   Prior to Admission medications   Medication Sig Start Date End Date Taking? Authorizing Provider  cephALEXin (KEFLEX) 500 MG capsule Take 1 capsule (500 mg total) by mouth 3 (three) times daily. 01/03/16   Joycie Peek, PA-C  metoprolol  (LOPRESSOR) 25 MG tablet Take 2 tablets (50 mg total) by mouth 2 (two) times daily. 11/02/14   Robyn M Hess, PA-C   BP 135/95 mmHg  Pulse 98  Temp(Src) 98.6 F (37 C) (Oral)  Resp 18  Ht 6' (1.829 m)  Wt 172.367 kg  BMI 51.53 kg/m2  SpO2 96% Physical Exam  Constitutional: He is oriented to person, place, and time. He appears well-developed and well-nourished.  Obese white male  HENT:  Head: Normocephalic and atraumatic.  Mouth/Throat: Oropharynx is clear and moist.  Eyes: Conjunctivae are normal. Pupils are equal, round, and reactive to light. Right eye exhibits no discharge. Left eye exhibits no discharge. No scleral icterus.  Neck: Neck supple.  Cardiovascular: Normal rate, regular rhythm and normal heart sounds.   Pulmonary/Chest: Effort normal and breath sounds normal. No respiratory distress. He has no wheezes. He has no rales.  Abdominal: Soft. He exhibits no distension and no mass. There is no tenderness. There is no rebound and no guarding.  Large panniculus  Musculoskeletal: He exhibits no tenderness.  Neurological: He is alert and oriented to person, place, and time.  Cranial Nerves II-XII grossly intact  Skin: Skin is warm and dry. No rash noted.  Psychiatric: He has a normal mood and affect.  Nursing note and vitals reviewed.   ED Course  Procedures (including critical care time) Labs Review Labs Reviewed  URINALYSIS, ROUTINE W REFLEX MICROSCOPIC (NOT AT [facility]) - Abnormal; Notable for the following:  Color, Urine AMBER (*)    APPearance CLOUDY (*)    Hgb urine dipstick LARGE (*)    Bilirubin Urine SMALL (*)    Ketones, ur 15 (*)    Protein, ur 30 (*)    Leukocytes, UA SMALL (*)    All other components within normal limits  URINE MICROSCOPIC-ADD ON - Abnormal; Notable for the following:    Squamous Epithelial / LPF 6-30 (*)    Bacteria, UA MANY (*)    Crystals CA OXALATE CRYSTALS (*)    All other components within normal limits  URINE CULTURE    Imaging  Review No results found. I have personally reviewed and evaluated these images and lab results as part of my medical decision-making.   EKG Interpretation None     Meds given in ED:  Medications - No data to display  New Prescriptions   CEPHALEXIN (KEFLEX) 500 MG CAPSULE    Take 1 capsule (500 mg total) by mouth 3 (three) times daily.   Filed Vitals:   01/03/16 1348  BP: 135/95  Pulse: 98  Temp: 98.6 F (37 C)  TempSrc: Oral  Resp: 18  Height: 6' (1.829 m)  Weight: 172.367 kg  SpO2: 96%    MDM  Tonny Bollmanimothy Seyler is a 25 y.o. male here for evaluation of urinary hesitancy since 7:00 this morning. Patient is an obese male, otherwise unremarkable physical exam, benign abdominal exam. Bedside ultrasound shows less than 30 mL in the bladder, doubt obstruction. Screening urinalysis with large hemoglobin, many bacteria, concerning for hemorrhagic cystitis. Urine culture obtained. Started on Keflex. Discussed follow-up with PCP in the next 3 days for reevaluation. Also discussed need for urological evaluation if symptoms do not improve or worsen. Referral given. Overall, appears well, nontoxic, hemodynamically stable and afebrile and appropriate for outpatient follow-up. Prior to patient discharge, I discussed and reviewed this case with Dr.Knott    Final diagnoses:  UTI (lower urinary tract infection)       Joycie PeekBenjamin Pa Tennant, PA-C 01/03/16 1530  Lyndal Pulleyaniel Knott, MD 01/03/16 2149

## 2016-01-05 LAB — URINE CULTURE

## 2016-04-21 IMAGING — CR DG CHEST 2V
2 series · 2 of 2 positions shown · non-contrast
Comparison: None.

CLINICAL DATA: Pleuritic chest pain. Shortness of breath. Duration:
1 day.

EXAM:
CHEST  2 VIEW

[w chest pa]
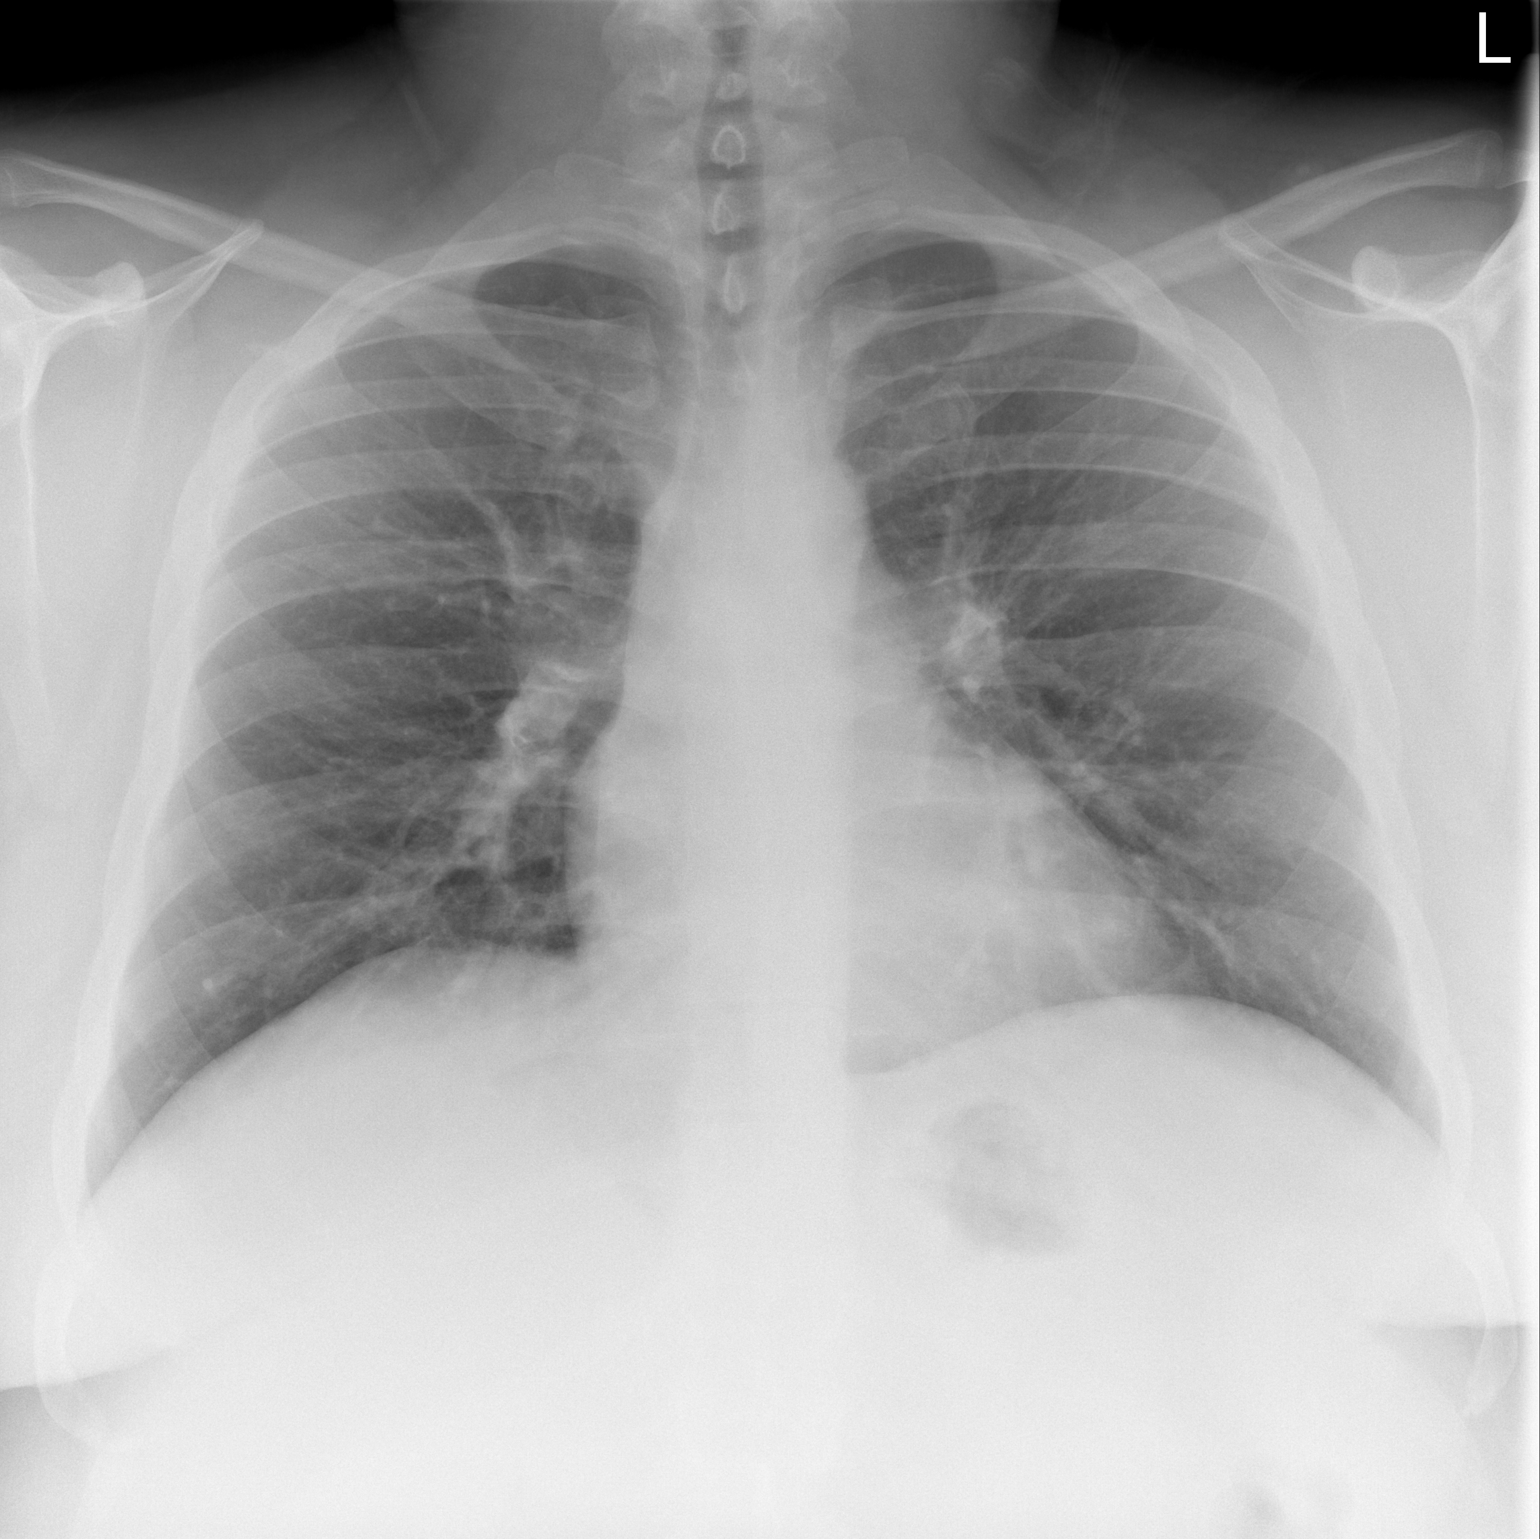

[w chest lat]
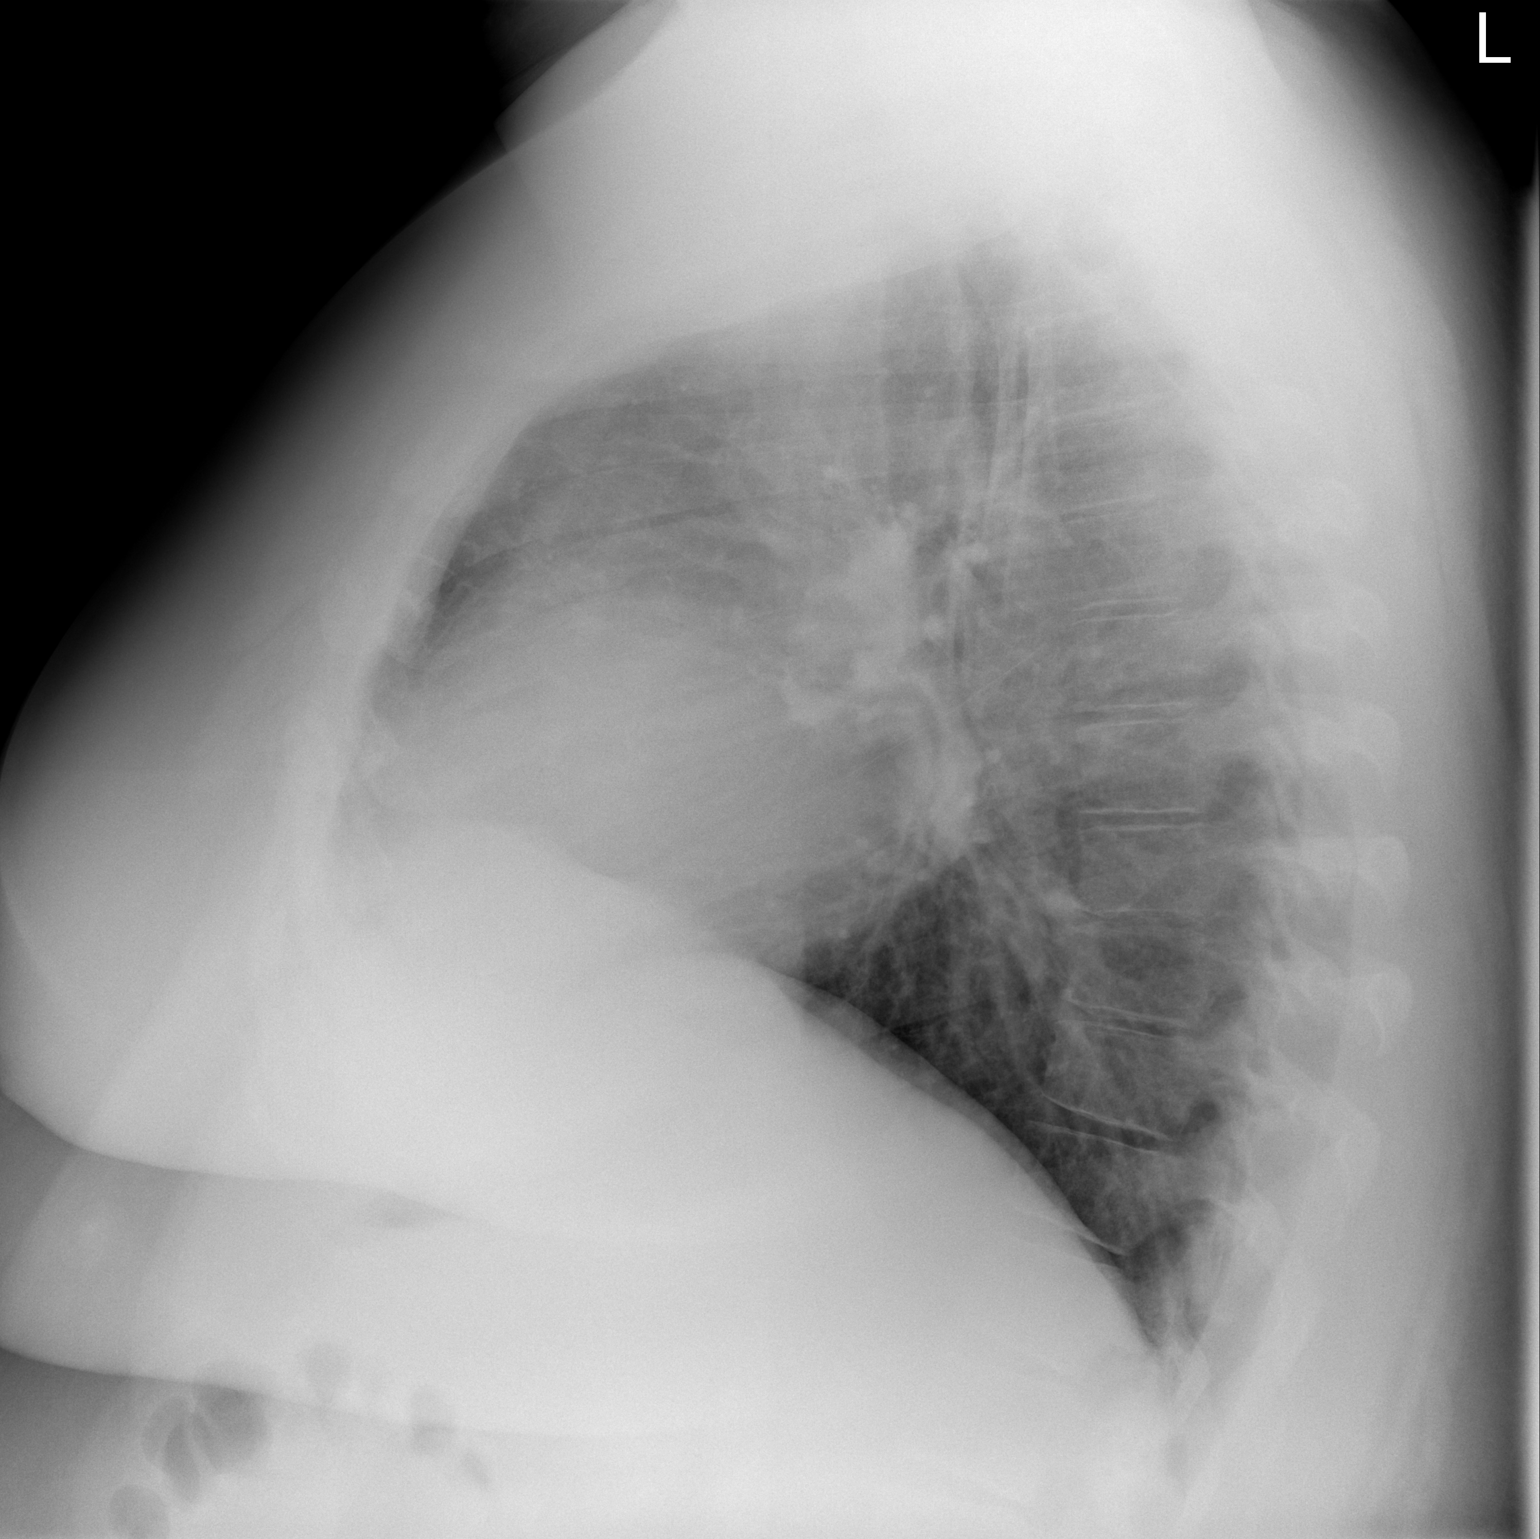

[2 of 2 positions shown; findings below may reference images not displayed]

FINDINGS: The lungs appear clear.  Cardiac and mediastinal contours normal.

No pleural effusion identified.
IMPRESSION: No active cardiopulmonary disease.

## 2016-09-01 ENCOUNTER — Emergency Department (HOSPITAL_COMMUNITY): Payer: BLUE CROSS/BLUE SHIELD

## 2016-09-01 ENCOUNTER — Observation Stay (HOSPITAL_COMMUNITY)
Admission: EM | Admit: 2016-09-01 | Discharge: 2016-09-02 | Disposition: A | Discharge status: Home / Self Care | Payer: BLUE CROSS/BLUE SHIELD | Attending: Internal Medicine | Admitting: Internal Medicine

## 2016-09-01 ENCOUNTER — Encounter (HOSPITAL_COMMUNITY): Payer: Self-pay

## 2016-09-01 DIAGNOSIS — E876 Hypokalemia: Secondary | ICD-10-CM | POA: Diagnosis present

## 2016-09-01 DIAGNOSIS — Z6841 Body Mass Index (BMI) 40.0 and over, adult: Secondary | ICD-10-CM | POA: Insufficient documentation

## 2016-09-01 DIAGNOSIS — I499 Cardiac arrhythmia, unspecified: Secondary | ICD-10-CM

## 2016-09-01 DIAGNOSIS — F17210 Nicotine dependence, cigarettes, uncomplicated: Secondary | ICD-10-CM | POA: Insufficient documentation

## 2016-09-01 DIAGNOSIS — E860 Dehydration: Secondary | ICD-10-CM | POA: Diagnosis not present

## 2016-09-01 DIAGNOSIS — J189 Pneumonia, unspecified organism: Secondary | ICD-10-CM | POA: Diagnosis not present

## 2016-09-01 DIAGNOSIS — R55 Syncope and collapse: Principal | ICD-10-CM | POA: Insufficient documentation

## 2016-09-01 DIAGNOSIS — E871 Hypo-osmolality and hyponatremia: Secondary | ICD-10-CM | POA: Insufficient documentation

## 2016-09-01 DIAGNOSIS — J101 Influenza due to other identified influenza virus with other respiratory manifestations: Secondary | ICD-10-CM | POA: Insufficient documentation

## 2016-09-01 DIAGNOSIS — R404 Transient alteration of awareness: Secondary | ICD-10-CM | POA: Diagnosis not present

## 2016-09-01 DIAGNOSIS — I498 Other specified cardiac arrhythmias: Secondary | ICD-10-CM | POA: Diagnosis present

## 2016-09-01 DIAGNOSIS — J1100 Influenza due to unidentified influenza virus with unspecified type of pneumonia: Secondary | ICD-10-CM | POA: Diagnosis not present

## 2016-09-01 DIAGNOSIS — R008 Other abnormalities of heart beat: Secondary | ICD-10-CM | POA: Insufficient documentation

## 2016-09-01 LAB — COMPREHENSIVE METABOLIC PANEL
ALT: 30 U/L (ref 17–63)
AST: 25 U/L (ref 15–41)
Albumin: 3.6 g/dL (ref 3.5–5.0)
Alkaline Phosphatase: 101 U/L (ref 38–126)
Anion gap: 11 (ref 5–15)
BUN: 9 mg/dL (ref 6–20)
CO2: 22 mmol/L (ref 22–32)
Calcium: 8.8 mg/dL — ABNORMAL LOW (ref 8.9–10.3)
Chloride: 100 mmol/L — ABNORMAL LOW (ref 101–111)
Creatinine, Ser: 0.89 mg/dL (ref 0.61–1.24)
GFR calc Af Amer: >60 mL/min (ref >60)
GFR calc non Af Amer: >60 mL/min (ref >60)
Glucose, Bld: 126 mg/dL — ABNORMAL HIGH (ref 65–99)
Potassium: 3.4 mmol/L — ABNORMAL LOW (ref 3.5–5.1)
Sodium: 133 mmol/L — ABNORMAL LOW (ref 135–145)
Total Bilirubin: 0.6 mg/dL (ref 0.3–1.2)
Total Protein: 6.9 g/dL (ref 6.5–8.1)

## 2016-09-01 LAB — URINALYSIS, ROUTINE W REFLEX MICROSCOPIC
Bacteria, UA: NONE SEEN
Bilirubin Urine: NEGATIVE
Glucose, UA: NEGATIVE mg/dL
Ketones, ur: NEGATIVE mg/dL
Leukocytes, UA: NEGATIVE
Nitrite: NEGATIVE
Protein, ur: 30 mg/dL — AB
Specific Gravity, Urine: 1.016 (ref 1.005–1.030)
pH: 5 (ref 5.0–8.0)

## 2016-09-01 LAB — TSH: TSH: 1.577 u[IU]/mL (ref 0.350–4.500)

## 2016-09-01 LAB — CBC
HCT: 42.6 % (ref 39.0–52.0)
Hemoglobin: 14.3 g/dL (ref 13.0–17.0)
MCH: 26.7 pg (ref 26.0–34.0)
MCHC: 33.6 g/dL (ref 30.0–36.0)
MCV: 79.5 fL (ref 78.0–100.0)
Platelets: 200 10*3/uL (ref 150–400)
RBC: 5.36 MIL/uL (ref 4.22–5.81)
RDW: 14.2 % (ref 11.5–15.5)
WBC: 9.5 10*3/uL (ref 4.0–10.5)

## 2016-09-01 LAB — PHOSPHORUS: Phosphorus: 3.5 mg/dL (ref 2.5–4.6)

## 2016-09-01 LAB — RAPID URINE DRUG SCREEN, HOSP PERFORMED
Amphetamines: NOT DETECTED
Barbiturates: NOT DETECTED
Benzodiazepines: NOT DETECTED
Cocaine: NOT DETECTED
Opiates: NOT DETECTED
Tetrahydrocannabinol: NOT DETECTED

## 2016-09-01 LAB — TROPONIN I: Troponin I: <0.03 ng/mL (ref <0.03)

## 2016-09-01 LAB — MAGNESIUM: Magnesium: 1.8 mg/dL (ref 1.7–2.4)

## 2016-09-01 LAB — I-STAT TROPONIN, ED: Troponin i, poc: 0.01 ng/mL (ref 0.00–0.08)

## 2016-09-01 LAB — BRAIN NATRIURETIC PEPTIDE: B Natriuretic Peptide: 32.6 pg/mL (ref 0.0–100.0)

## 2016-09-01 MED ORDER — SODIUM CHLORIDE 0.9 % IV SOLN
INTRAVENOUS | Status: DC
  Administered 2016-09-01: 75 mL/h via INTRAVENOUS
  Administered 2016-09-01: 19:00:00 via INTRAVENOUS

## 2016-09-01 MED ORDER — POTASSIUM CHLORIDE CRYS ER 20 MEQ PO TBCR
40.0000 meq | EXTENDED_RELEASE_TABLET | Freq: Once | ORAL | Status: AC
  Administered 2016-09-01: 40 meq via ORAL
  Filled 2016-09-01: qty 2

## 2016-09-01 MED ORDER — SODIUM CHLORIDE 0.9 % IV SOLN
1.0000 g | Freq: Once | INTRAVENOUS | Status: AC
  Administered 2016-09-01: 1 g via INTRAVENOUS
  Filled 2016-09-01: qty 10

## 2016-09-01 MED ORDER — SENNOSIDES-DOCUSATE SODIUM 8.6-50 MG PO TABS: 1.0000 | ORAL_TABLET | Freq: Every evening | ORAL | Status: DC | PRN

## 2016-09-01 MED ORDER — ENOXAPARIN SODIUM 40 MG/0.4ML ~~LOC~~ SOLN
40.0000 mg | SUBCUTANEOUS | Status: DC
  Filled 2016-09-01: qty 0.4

## 2016-09-01 MED ORDER — ENOXAPARIN SODIUM 100 MG/ML ~~LOC~~ SOLN
85.0000 mg | SUBCUTANEOUS | Status: DC
  Filled 2016-09-01: qty 1

## 2016-09-01 MED ORDER — ONDANSETRON HCL 4 MG PO TABS: 4.0000 mg | ORAL_TABLET | Freq: Four times a day (QID) | ORAL | Status: DC | PRN

## 2016-09-01 MED ORDER — SODIUM CHLORIDE 0.9% FLUSH
3.0000 mL | Freq: Two times a day (BID) | INTRAVENOUS | Status: DC
  Administered 2016-09-01 – 2016-09-02 (×2): 3 mL via INTRAVENOUS

## 2016-09-01 MED ORDER — HYDROMORPHONE HCL 2 MG/ML IJ SOLN: 0.5000 mg | INTRAMUSCULAR | Status: DC | PRN

## 2016-09-01 MED ORDER — ACETAMINOPHEN 325 MG PO TABS: 650.0000 mg | ORAL_TABLET | Freq: Four times a day (QID) | ORAL | Status: DC | PRN

## 2016-09-01 MED ORDER — ACETAMINOPHEN 650 MG RE SUPP: 650.0000 mg | Freq: Four times a day (QID) | RECTAL | Status: DC | PRN

## 2016-09-01 MED ORDER — SODIUM CHLORIDE 0.9 % IV BOLUS (SEPSIS)
1000.0000 mL | Freq: Once | INTRAVENOUS | Status: AC
  Administered 2016-09-01: 1000 mL via INTRAVENOUS

## 2016-09-01 MED ORDER — HYDROMORPHONE HCL 1 MG/ML IJ SOLN: 0.5000 mg | INTRAMUSCULAR | Status: DC | PRN

## 2016-09-01 MED ORDER — HYDROCODONE-ACETAMINOPHEN 5-325 MG PO TABS
1.0000 | ORAL_TABLET | ORAL | Status: DC | PRN
  Administered 2016-09-02: 1 via ORAL
  Filled 2016-09-01: qty 1

## 2016-09-01 MED ORDER — ONDANSETRON HCL 4 MG/2ML IJ SOLN: 4.0000 mg | Freq: Four times a day (QID) | INTRAMUSCULAR | Status: DC | PRN

## 2016-09-01 NOTE — Progress Notes (Signed)
Pt received from ED. Pt and mother oriented to room and equipment. VSS. Telemetry applied, CCMD notified x2. Call bell within reach, will continue to monitor.   Leonidas Rombergaitlin S Bumbledare, RN

## 2016-09-01 NOTE — ED Provider Notes (Addendum)
[facility]-EMERGENCY DEPT Provider Note   CSN: [id number] Arrival date & time: 09/01/16  1438     History   Chief Complaint Chief Complaint  Patient presents with  . Near Syncope  . Cough    HPI Roger Salinas is a 26 y.o. male.  HPI   26 yo M with no significant PMHx here with near syncopal episode. Pt states that over the past 3-4 days he has had mild cough, congestion, and one day of fever. He has been taking dayquil and nyquil frequently. Earlier today, he began feeling more tired than usual but was able to go to work. At work, he sat down to have a bowel movement. When he stood up, he began to experience significant lightheadedness, dizziness, and a feeling that he was going to pass out. He sat down and his sx slowly resolved over several minutes. He called EMS who brought him here. Currently he "feels fine." Denies any palpitations. EKG in triage showed bigeminy.  History reviewed. No pertinent past medical history.  Patient Active Problem List   Diagnosis Date Noted  . Near syncope 09/01/2016  . Bigeminal rhythm 09/01/2016  . Hypokalemia 09/01/2016  . Hyponatremia 09/01/2016  . Bigeminy 09/01/2016  . Right knee injury 11/22/2011    Past Surgical History:  Procedure Laterality Date  . TONSILLECTOMY         Home Medications    Prior to Admission medications   Medication Sig Start Date End Date Taking? Authorizing Provider  cephALEXin (KEFLEX) 500 MG capsule Take 1 capsule (500 mg total) by mouth 3 (three) times daily. 01/03/16   Joycie PeekBenjamin Cartner, PA-C  metoprolol (LOPRESSOR) 25 MG tablet Take 2 tablets (50 mg total) by mouth 2 (two) times daily. 11/02/14   Kathrynn Speedobyn M Hess, PA-C    Family History Family History  Problem Relation Age of Onset  . Hypertension Mother   . Diabetes Neg Hx   . Heart attack Neg Hx   . Hyperlipidemia Neg Hx   . Sudden death Neg Hx     Social History Social History  Substance Use Topics  . Smoking status: Current Every Day Smoker   Packs/day: 1.00    Years: 10.00    Types: Cigarettes  . Smokeless tobacco: Never Used  . Alcohol use Yes     Allergies   Patient has no known allergies.   Review of Systems Review of Systems  Constitutional: Positive for fatigue. Negative for chills and fever.  HENT: Positive for congestion and rhinorrhea.   Eyes: Negative for visual disturbance.  Respiratory: Positive for cough. Negative for shortness of breath and wheezing.   Cardiovascular: Negative for chest pain and leg swelling.  Gastrointestinal: Negative for abdominal pain, diarrhea, nausea and vomiting.  Genitourinary: Negative for dysuria and flank pain.  Musculoskeletal: Negative for neck pain and neck stiffness.  Skin: Negative for rash and wound.  Allergic/Immunologic: Negative for immunocompromised state.  Neurological: Negative for syncope, weakness and headaches.  All other systems reviewed and are negative.    Physical Exam Updated Vital Signs BP 132/62   Pulse (!) 51   Resp 19   Ht 6' (1.829 m)   Wt (!) 375 lb (170.1 kg)   SpO2 96%   BMI 50.86 kg/m   Physical Exam  Constitutional: He is oriented to person, place, and time. He appears well-developed and well-nourished. No distress.  HENT:  Head: Normocephalic and atraumatic.  Mouth/Throat: Oropharynx is clear and moist. No oropharyngeal exudate.  Eyes: Conjunctivae are normal.  Neck: Neck supple.  Cardiovascular: S1 normal, S2 normal, normal heart sounds and normal pulses.  Frequent extrasystoles are present. Tachycardia present.  Exam reveals no friction rub.   No murmur heard. Pulmonary/Chest: Effort normal and breath sounds normal. No respiratory distress. He has no wheezes. He has no rales.  Abdominal: He exhibits no distension.  Musculoskeletal: He exhibits no edema.  Neurological: He is alert and oriented to person, place, and time. He exhibits normal muscle tone.  Skin: Skin is warm. Capillary refill takes less than 2 seconds.    Psychiatric: He has a normal mood and affect.  Nursing note and vitals reviewed.    ED Treatments / Results  Labs (all labs ordered are listed, but only abnormal results are displayed) Labs Reviewed  COMPREHENSIVE METABOLIC PANEL - Abnormal; Notable for the following:       Result Value   Sodium 133 (*)    Potassium 3.4 (*)    Chloride 100 (*)    Glucose, Bld 126 (*)    Calcium 8.8 (*)    All other components within normal limits  CBC  MAGNESIUM  PHOSPHORUS  TSH  BRAIN NATRIURETIC PEPTIDE  I-STAT TROPOININ, ED    EKG  EKG Interpretation  Date/Time:  Saturday September 01 2016 14:49:53 EST Ventricular Rate:  113 PR Interval:    QRS Duration: 104 QT Interval:  344 QTC Calculation: 407 R Axis:   46 Text Interpretation:  Sinus tachycardia Ventricular bigeminy Since last tracing, Bigeminy is new Confirmed by Keyton Bhat MD, Killian Ress [phone]) on 09/01/2016 3:03:37 PM       Radiology Dg Chest Portable 1 View  Result Date: 09/01/2016 CLINICAL DATA:  Near syncope EXAM: PORTABLE CHEST 1 VIEW COMPARISON:  11/02/2014 FINDINGS: The heart is borderline enlarged. Lungs are clear. No pneumothorax or pleural effusion. IMPRESSION: No active disease. Electronically Signed   By: Jolaine Click M.D.   On: 09/01/2016 15:13    Procedures Procedures (including critical care time)  Medications Ordered in ED Medications  sodium chloride 0.9 % bolus 1,000 mL (1,000 mLs Intravenous New Bag/Given 09/01/16 1538)  potassium chloride SA (K-DUR,KLOR-CON) CR tablet 40 mEq (not administered)  calcium gluconate 1 g in sodium chloride 0.9 % 100 mL IVPB (not administered)     Initial Impression / Assessment and Plan / ED Course  I have reviewed the triage vital signs and the nursing notes.  Pertinent labs & imaging results that were available during my care of the patient were reviewed by me and considered in my medical decision making (see chart for details).     26 yo M with PMHx obesity here with  presyncopal episode in setting of likely viral URI x 1 week. On arrival, pt tachycardic, with EKG showing Bigeminy. BP normal. Telemetry shows intermittent bigeminy, frequent PVCs. Concern that pt's sx were 2/2 transient arrhythmia, possibly VTach, given his marked ectopy on monitor. DDx also includes orthostasis. Etiology for ectopy unclear - may be 2/2 electrolyte abnormality, illness, or possibly medication use. Will keep on monitor, check labs. CXR clear.  Labs show mild hypokalemia, hypocalcemia. Will replete. Pt no longer in bigeminy but with frequent PVCs - admit to obs.  Final Clinical Impressions(s) / ED Diagnoses   Final diagnoses:  Ventricular bigeminy  Hypokalemia  Hypocalcemia    New Prescriptions New Prescriptions   No medications on file     Shaune Pollack, MD 09/01/16 1614    Shaune Pollack, MD 09/01/16 [phone]

## 2016-09-01 NOTE — H&P (Signed)
History and Physical    Nader Boys GLO:[id number] DOB: Apr 18, 1991 DOA: 09/01/2016  PCP: No PCP Per Patient Patient coming from: home  Chief Complaint: near syncope/cough  HPI: Roger Salinas is a 26 y.o. male with medical history significant for ob presents to the emergency Department chief complaint of near syncope. Initial evaluation reveals bigeminal rhythm hyponatremia hypokalemia.  Information is obtained from the patient. He states for the last 3-4 days he has suffered from sinus congestion pulmonary congestion/cough chills subjective fever. He states he started taking NyQuill and DayQuil yesterday. This morning while at work and after [facility] he experienced significant lightheadedness dizziness and was afraid he was going to pass out. He sat down quickly and his symptoms resolved. He denies headache visual disturbances chest pain palpitation shortness of breath. He denies diaphoresis nausea vomiting. He denies dysuria hematuria frequency or urgency. He denies diarrhea constipation melena bright red blood per rectum He states he has been eating and drinking his normal amount but has been sleeping a little more than usual.    ED Course: He is afebrile hemodynamically stable and not hypoxic. He is provided with IV fluids  Review of Systems: As per HPI otherwise 10 point review of systems negative.   Ambulatory Status: He relates independently  History reviewed. No pertinent past medical history.  Past Surgical History:  Procedure Laterality Date  . TONSILLECTOMY      Social History   Social History  . Marital status: Married    Spouse name: N/A  . Number of children: N/A  . Years of education: N/A   Occupational History  . Not on file.   Social History Main Topics  . Smoking status: Current Every Day Smoker    Packs/day: 1.00    Years: 10.00    Types: Cigarettes  . Smokeless tobacco: Never Used  . Alcohol use Yes  . Drug use: No  . Sexual activity: Not on file    Other Topics Concern  . Not on file   Social History Narrative  . No narrative on file  Lives at home with his wife. He works at a [employer].  No Known Allergies  Family History  Problem Relation Age of Onset  . Hypertension Mother   . Diabetes Neg Hx   . Heart attack Neg Hx   . Hyperlipidemia Neg Hx   . Sudden death Neg Hx     Prior to Admission medications   Medication Sig Start Date End Date Taking? Authorizing Provider  ibuprofen (ADVIL,MOTRIN) 200 MG tablet Take 200 mg by mouth every 6 (six) hours as needed for headache.   Yes Historical Provider, MD    Physical Exam: Vitals:   09/01/16 1515 09/01/16 1545 09/01/16 1600 09/01/16 1615  BP: 132/62 143/85 145/85 138/81  Pulse: (!) 51 (!) 49 91 100  Resp: 19 18 21 17   SpO2: 96% 99% 100% 97%  Weight:      Height:         General:  Appears calm and comfortable Obese Eyes:  PERRL, EOMI, normal lids, iris ENT:  grossly normal hearing, lips & tongue, his membranes of his mouth are pink but dry Neck:  no LAD, masses or thyromegaly Cardiovascular:  RRR, no m/r/g. No LE edema.  Respiratory:  Normal effort. Breath sounds are distant. Some faint end expiratory wheezing. Abdomen:  soft, ntnd, obese positive bowel sounds no guarding or rebounding Skin:  no rash or induration seen on limited exam Musculoskeletal:  grossly normal tone BUE/BLE, good  ROM, no bony abnormality Psychiatric:  grossly normal mood and affect, speech fluent and appropriate, AOx3 Neurologic:  CN 2-12 grossly intact, moves all extremities in coordinated fashion, sensation intact  Labs on Admission: I have personally reviewed following labs and imaging studies  CBC:  Recent Labs Lab 09/01/16 1452  WBC 9.5  HGB 14.3  HCT 42.6  MCV 79.5  PLT 200   Basic Metabolic Panel:  Recent Labs Lab 09/01/16 1452  NA 133*  K 3.4*  CL 100*  CO2 22  GLUCOSE 126*  BUN 9  CREATININE 0.89  CALCIUM 8.8*  MG 1.8  PHOS 3.5   GFR: Estimated  Creatinine Clearance: 202.1 mL/min (by C-G formula based on SCr of 0.89 mg/dL). Liver Function Tests:  Recent Labs Lab 09/01/16 1452  AST 25  ALT 30  ALKPHOS 101  BILITOT 0.6  PROT 6.9  ALBUMIN 3.6   No results for input(s): LIPASE, AMYLASE in the last 168 hours. No results for input(s): AMMONIA in the last 168 hours. Coagulation Profile: No results for input(s): INR, PROTIME in the last 168 hours. Cardiac Enzymes: No results for input(s): CKTOTAL, CKMB, CKMBINDEX, TROPONINI in the last 168 hours. BNP (last 3 results) No results for input(s): PROBNP in the last 8760 hours. HbA1C: No results for input(s): HGBA1C in the last 72 hours. CBG: No results for input(s): GLUCAP in the last 168 hours. Lipid Profile: No results for input(s): CHOL, HDL, LDLCALC, TRIG, CHOLHDL, LDLDIRECT in the last 72 hours. Thyroid Function Tests:  Recent Labs  09/01/16 1534  TSH 1.577   Anemia Panel: No results for input(s): VITAMINB12, FOLATE, FERRITIN, TIBC, IRON, RETICCTPCT in the last 72 hours. Urine analysis:    Component Value Date/Time   COLORURINE AMBER (A) 01/03/2016 1310   APPEARANCEUR CLOUDY (A) 01/03/2016 1310   LABSPEC 1.027 01/03/2016 1310   PHURINE 5.5 01/03/2016 1310   GLUCOSEU NEGATIVE 01/03/2016 1310   HGBUR LARGE (A) 01/03/2016 1310   BILIRUBINUR SMALL (A) 01/03/2016 1310   KETONESUR 15 (A) 01/03/2016 1310   PROTEINUR 30 (A) 01/03/2016 1310   NITRITE NEGATIVE 01/03/2016 1310   LEUKOCYTESUR SMALL (A) 01/03/2016 1310    Creatinine Clearance: Estimated Creatinine Clearance: 202.1 mL/min (by C-G formula based on SCr of 0.89 mg/dL).  Sepsis Labs: @LABRCNTIP (procalcitonin:4,lacticidven:4) )No results found for this or any previous visit (from the past 240 hour(s)).   Radiological Exams on Admission: Dg Chest Portable 1 View  Result Date: 09/01/2016 CLINICAL DATA:  Near syncope EXAM: PORTABLE CHEST 1 VIEW COMPARISON:  11/02/2014 FINDINGS: The heart is borderline  enlarged. Lungs are clear. No pneumothorax or pleural effusion. IMPRESSION: No active disease. Electronically Signed   By: Jolaine Click M.D.   On: 09/01/2016 15:13    EKG: Independently reviewed. Sinus tachycardia Ventricular bigeminy  Assessment/Plan Principal Problem:   Near syncope Active Problems:   Bigeminal rhythm   Hypokalemia   Hyponatremia   Obesity   #1. Near-syncope. Likely related to dehydration as a result of recent URI and bigeminy related to OTC cough medicine. Chest x-ray no active disease. No signs infectious process. Only mild metabolic abnormalities.  -Admit to telemetry -Cycle cardiac enzymes -Repeat EKG in the a.m. -Obtain orthostatic vital signs -Obtain an echocardiogram for completeness  #2. Bigeminal rhythm. Likely related to over-the-counter cough medicine. Mild hypokalemia mild hyponatremia -Hold anti-tussives -Replete potassium -Gentle IV fluids -monitor on telemetry -repeat ekg in am  #3. Obesity. BMI 51 -nutritional consult    DVT prophylaxis: lovenox Code Status: full  Family  Communication: wife and mother  Disposition Plan: home  Consults called: none  Admission status: obs    Toya SmothersBLACK,KAREN M MD [facility]  If 7PM-7AM, please contact night-coverage [email or web address] Password [email or web address]  09/01/2016, 4:46 PM

## 2016-09-01 NOTE — ED Triage Notes (Signed)
Pt arrives [facility],with c/o near syncope on standing after BM at work. Pt denies chest pain or SHOB but states cough and congestion with sinus preassure over last several days. Monitor for EMS indicates SR at rate of about 110 witih  Frequent PVCs for bigemeny ,trigemeny and quadragimeny with periods of simple SR. P now staes he feels he need to have Diarrhea BM.

## 2016-09-02 ENCOUNTER — Observation Stay (HOSPITAL_BASED_OUTPATIENT_CLINIC_OR_DEPARTMENT_OTHER): Payer: BLUE CROSS/BLUE SHIELD

## 2016-09-02 DIAGNOSIS — I499 Cardiac arrhythmia, unspecified: Secondary | ICD-10-CM

## 2016-09-02 DIAGNOSIS — J1100 Influenza due to unidentified influenza virus with unspecified type of pneumonia: Secondary | ICD-10-CM | POA: Diagnosis not present

## 2016-09-02 DIAGNOSIS — J101 Influenza due to other identified influenza virus with other respiratory manifestations: Secondary | ICD-10-CM | POA: Diagnosis not present

## 2016-09-02 DIAGNOSIS — E876 Hypokalemia: Secondary | ICD-10-CM | POA: Diagnosis not present

## 2016-09-02 DIAGNOSIS — R55 Syncope and collapse: Secondary | ICD-10-CM

## 2016-09-02 DIAGNOSIS — J189 Pneumonia, unspecified organism: Secondary | ICD-10-CM | POA: Diagnosis not present

## 2016-09-02 LAB — RESPIRATORY PANEL BY PCR
Adenovirus: NOT DETECTED
Bordetella pertussis: NOT DETECTED
Chlamydophila pneumoniae: NOT DETECTED
Coronavirus 229E: NOT DETECTED
Coronavirus HKU1: NOT DETECTED
Coronavirus NL63: NOT DETECTED
Coronavirus OC43: NOT DETECTED
Influenza A H3: DETECTED — AB
Influenza B: NOT DETECTED
Metapneumovirus: NOT DETECTED
Mycoplasma pneumoniae: NOT DETECTED
Parainfluenza Virus 1: NOT DETECTED
Parainfluenza Virus 2: NOT DETECTED
Parainfluenza Virus 3: NOT DETECTED
Parainfluenza Virus 4: NOT DETECTED
Respiratory Syncytial Virus: NOT DETECTED
Rhinovirus / Enterovirus: NOT DETECTED

## 2016-09-02 LAB — ECHOCARDIOGRAM COMPLETE
FS: 36 % (ref 28–44)
Height: 72 in
IVS/LV PW RATIO, ED: 0.97
LA ID, A-P, ES: 45 mm
LA diam end sys: 45 mm
LA diam index: 1.58 cm/m2
LA vol A4C: 84.1 ml
LA vol index: 27 mL/m2
LA vol: 76.7 mL
LV PW d: 11.7 mm — AB (ref 0.6–1.1)
LVOT area: 4.52 cm2
LVOT diameter: 24 mm
Lateral S' vel: 11 cm/s
TAPSE: 17.5 mm
Weight: 6281.6 oz

## 2016-09-02 LAB — BASIC METABOLIC PANEL
Anion gap: 8 (ref 5–15)
BUN: 9 mg/dL (ref 6–20)
CO2: 24 mmol/L (ref 22–32)
Calcium: 8.4 mg/dL — ABNORMAL LOW (ref 8.9–10.3)
Chloride: 104 mmol/L (ref 101–111)
Creatinine, Ser: 0.82 mg/dL (ref 0.61–1.24)
GFR calc Af Amer: >60 mL/min (ref >60)
GFR calc non Af Amer: >60 mL/min (ref >60)
Glucose, Bld: 129 mg/dL — ABNORMAL HIGH (ref 65–99)
Potassium: 3.4 mmol/L — ABNORMAL LOW (ref 3.5–5.1)
Sodium: 136 mmol/L (ref 135–145)

## 2016-09-02 LAB — GLUCOSE, CAPILLARY: Glucose-Capillary: 126 mg/dL — ABNORMAL HIGH (ref 65–99)

## 2016-09-02 LAB — PHOSPHORUS: Phosphorus: 3.4 mg/dL (ref 2.5–4.6)

## 2016-09-02 LAB — MAGNESIUM: Magnesium: 1.9 mg/dL (ref 1.7–2.4)

## 2016-09-02 MED ORDER — OSELTAMIVIR PHOSPHATE 75 MG PO CAPS
75.0000 mg | ORAL_CAPSULE | Freq: Two times a day (BID) | ORAL | Status: DC
  Administered 2016-09-02: 75 mg via ORAL
  Filled 2016-09-02 (×2): qty 1

## 2016-09-02 MED ORDER — POTASSIUM CHLORIDE CRYS ER 20 MEQ PO TBCR
40.0000 meq | EXTENDED_RELEASE_TABLET | Freq: Once | ORAL | Status: AC
  Administered 2016-09-02: 40 meq via ORAL
  Filled 2016-09-02: qty 2

## 2016-09-02 MED ORDER — OSELTAMIVIR PHOSPHATE 75 MG PO CAPS: 75.0000 mg | ORAL_CAPSULE | Freq: Two times a day (BID) | ORAL | 0 refills | Status: DC

## 2016-09-02 MED ORDER — MAGNESIUM SULFATE 2 GM/50ML IV SOLN
2.0000 g | Freq: Once | INTRAVENOUS | Status: AC
Start: 2016-09-02 — End: 2016-09-02
  Administered 2016-09-02: 2 g via INTRAVENOUS
  Filled 2016-09-02 (×2): qty 50

## 2016-09-02 MED ORDER — GUAIFENESIN ER 600 MG PO TB12
600.0000 mg | ORAL_TABLET | Freq: Two times a day (BID) | ORAL | Status: DC
  Administered 2016-09-02 (×2): 600 mg via ORAL
  Filled 2016-09-02 (×2): qty 1

## 2016-09-02 NOTE — Discharge Summary (Addendum)
Physician Discharge Summary  Roger Salinas GNF:[id number] DOB: Nov 12, 1990 DOA: 09/01/2016  PCP: No PCP Per Patient  Admit date: 09/01/2016 Discharge date: 09/02/2016  Recommendations for Outpatient Follow-up:  1. Continue tamiflu for 5 days on discharge   Discharge Diagnoses:  Principal Problem:   Near syncope Active Problems:   Bigeminal rhythm   Hypokalemia   Hyponatremia   Obesity    Discharge Condition: stable   Diet recommendation: as tolerated   History of present illness:   Per HPI "26 y.o. male with medical history significant for ob presents to the emergency Department chief complaint of near syncope. Initial evaluation reveals bigeminal rhythm hyponatremia hypokalemia. Information is obtained from the patient. He states for the last 3-4 days he has suffered from sinus congestion pulmonary congestion/cough chills subjective fever. He states he started taking NyQuill and DayQuil yesterday. This morning while at work and after [facility] he experienced significant lightheadedness dizziness and was afraid he was going to pass out. He sat down quickly and his symptoms resolved. He denies headache visual disturbances chest pain palpitation shortness of breath. He denies diaphoresis nausea vomiting. He denies dysuria hematuria frequency or urgency. He denies diarrhea constipation melena bright red blood per rectum He states he has been eating and drinking his normal amount but has been sleeping a little more than usual."   Hospital Course:   Principal Problem:   Near syncope / Influenza A pneumonia  - Likely from flu, dehydration - Feels better - ECHO pending  - Tamiflu for 5 days on discharge   Active Problems:   Bigeminal rhythm - Likely pt has OSA, spoke with cardio on call - Repeat 12 lead EKG sinus rhythm - Supplemented potassium and magnesium    Hypokalemia - Supplemented     Morbid obesity - Body mass index is 53.25 kg/m. - Counseled pt on diet and weight loss  this am    Signed:  Manson Passey, MD  [facility] 09/02/2016, 2:55 PM  Pager #: [phone]  Time spent in minutes: less than 30 minutes  Procedures:  2 D ECHO  Consultations:  Cardio - Dr. Daneil Dolin - phone call only   Discharge Exam: Vitals:   09/01/16 2050 09/02/16 0625  BP: 138/64 127/67  Pulse: (!) 101 94  Resp: 18 18  Temp: 99.1 F (37.3 C) 98 F (36.7 C)   Vitals:   09/01/16 1700 09/01/16 1735 09/01/16 2050 09/02/16 0625  BP: 131/82 (!) 147/85 138/64 127/67  Pulse: 99 92 (!) 101 94  Resp: 24 (!) 24 18 18   Temp:  98.5 F (36.9 C) 99.1 F (37.3 C) 98 F (36.7 C)  TempSrc:  Oral Oral Oral  SpO2: 98% 98% 93% 94%  Weight:    (!) 178.1 kg (392 lb 9.6 oz)  Height:        General: Pt is alert, follows commands appropriately, not in acute distress Cardiovascular: Regular rate and rhythm, S1/S2 + Respiratory: Clear to auscultation bilaterally, no wheezing, no crackles, no rhonchi Abdominal: Soft, non tender, non distended, bowel sounds +, no guarding Extremities: no edema, no cyanosis, pulses palpable bilaterally DP and PT Neuro: Grossly nonfocal  Discharge Instructions  Discharge Instructions    Call MD for:  persistant nausea and vomiting    Complete by:  As directed    Call MD for:  redness, tenderness, or signs of infection (pain, swelling, redness, odor or green/yellow discharge around incision site)    Complete by:  As directed    Call MD for:  severe uncontrolled pain    Complete by:  As directed    Diet - low sodium heart healthy    Complete by:  As directed    Discharge instructions    Complete by:  As directed    Continue tamiflu for 5 days on discharge   Increase activity slowly    Complete by:  As directed      Allergies as of 09/02/2016   No Known Allergies     Medication List    TAKE these medications   ibuprofen 200 MG tablet Commonly known as:  ADVIL,MOTRIN Take 200 mg by mouth every 6 (six) hours as needed for  headache.   oseltamivir 75 MG capsule Commonly known as:  TAMIFLU Take 1 capsule (75 mg total) by mouth 2 (two) times daily.         The results of significant diagnostics from this hospitalization (including imaging, microbiology, ancillary and laboratory) are listed below for reference.    Significant Diagnostic Studies: Dg Chest Portable 1 View  Result Date: 09/01/2016 CLINICAL DATA:  Near syncope EXAM: PORTABLE CHEST 1 VIEW COMPARISON:  11/02/2014 FINDINGS: The heart is borderline enlarged. Lungs are clear. No pneumothorax or pleural effusion. IMPRESSION: No active disease. Electronically Signed   By: Jolaine ClickArthur  Hoss M.D.   On: 09/01/2016 15:13    Microbiology: Recent Results (from the past 240 hour(s))  Respiratory Panel by PCR     Status: Abnormal   Collection Time: 09/01/16  8:34 PM  Result Value Ref Range Status   Adenovirus NOT DETECTED NOT DETECTED Final   Coronavirus 229E NOT DETECTED NOT DETECTED Final   Coronavirus HKU1 NOT DETECTED NOT DETECTED Final   Coronavirus NL63 NOT DETECTED NOT DETECTED Final   Coronavirus OC43 NOT DETECTED NOT DETECTED Final   Metapneumovirus NOT DETECTED NOT DETECTED Final   Rhinovirus / Enterovirus NOT DETECTED NOT DETECTED Final   Influenza A H3 DETECTED (A) NOT DETECTED Final   Influenza B NOT DETECTED NOT DETECTED Final   Parainfluenza Virus 1 NOT DETECTED NOT DETECTED Final   Parainfluenza Virus 2 NOT DETECTED NOT DETECTED Final   Parainfluenza Virus 3 NOT DETECTED NOT DETECTED Final   Parainfluenza Virus 4 NOT DETECTED NOT DETECTED Final   Respiratory Syncytial Virus NOT DETECTED NOT DETECTED Final   Bordetella pertussis NOT DETECTED NOT DETECTED Final   Chlamydophila pneumoniae NOT DETECTED NOT DETECTED Final   Mycoplasma pneumoniae NOT DETECTED NOT DETECTED Final     Labs: Basic Metabolic Panel:  Recent Labs Lab 09/01/16 1452 09/02/16 0201  NA 133* 136  K 3.4* 3.4*  CL 100* 104  CO2 22 24  GLUCOSE 126* 129*  BUN 9 9   CREATININE 0.89 0.82  CALCIUM 8.8* 8.4*  MG 1.8 1.9  PHOS 3.5 3.4   Liver Function Tests:  Recent Labs Lab 09/01/16 1452  AST 25  ALT 30  ALKPHOS 101  BILITOT 0.6  PROT 6.9  ALBUMIN 3.6   No results for input(s): LIPASE, AMYLASE in the last 168 hours. No results for input(s): AMMONIA in the last 168 hours. CBC:  Recent Labs Lab 09/01/16 1452  WBC 9.5  HGB 14.3  HCT 42.6  MCV 79.5  PLT 200   Cardiac Enzymes:  Recent Labs Lab 09/01/16 1927  TROPONINI <0.03   BNP: BNP (last 3 results)  Recent Labs  09/01/16 1534  BNP 32.6    ProBNP (last 3 results) No results for input(s): PROBNP in the last 8760 hours.  CBG:  Recent  Labs Lab 09/02/16 0621  GLUCAP 126*

## 2016-09-02 NOTE — Progress Notes (Signed)
Received pt from [place]. Patient is pending discharge per Echo. Called Echo. Gave patient tamiflu. Will continue to monitor.  Minerva Endsiffany N Gina Costilla RN

## 2016-09-02 NOTE — Discharge Instructions (Signed)

## 2016-09-02 NOTE — Progress Notes (Signed)
Discharged after ECHO per MD order. Magnesium was replaced prior. Left arm IV removed, telemetry d/c, CCMD notified. AVS completed with patient and mother. Expressed the importance of getting a primary care physician and following up with labs, ECHO, and possible sleep study. No further questions at this time. Patient to home via car with mother.  Minerva Endsiffany N Nirali Magouirk RN

## 2016-09-02 NOTE — Progress Notes (Signed)
  Echocardiogram 2D Echocardiogram has been performed.  Delcie RochENNINGTON, Joon Pohle 09/02/2016, 4:50 PM

## 2017-04-05 ENCOUNTER — Emergency Department (HOSPITAL_BASED_OUTPATIENT_CLINIC_OR_DEPARTMENT_OTHER)
Admission: EM | Admit: 2017-04-05 | Discharge: 2017-04-05 | Disposition: A | Discharge status: Home / Self Care | Payer: BLUE CROSS/BLUE SHIELD | Attending: Emergency Medicine | Admitting: Emergency Medicine

## 2017-04-05 ENCOUNTER — Encounter (HOSPITAL_BASED_OUTPATIENT_CLINIC_OR_DEPARTMENT_OTHER): Payer: Self-pay | Admitting: *Deleted

## 2017-04-05 DIAGNOSIS — Z23 Encounter for immunization: Secondary | ICD-10-CM | POA: Insufficient documentation

## 2017-04-05 DIAGNOSIS — F17210 Nicotine dependence, cigarettes, uncomplicated: Secondary | ICD-10-CM | POA: Diagnosis not present

## 2017-04-05 DIAGNOSIS — L089 Local infection of the skin and subcutaneous tissue, unspecified: Secondary | ICD-10-CM | POA: Diagnosis present

## 2017-04-05 DIAGNOSIS — L02411 Cutaneous abscess of right axilla: Secondary | ICD-10-CM | POA: Diagnosis not present

## 2017-04-05 MED ORDER — DOXYCYCLINE HYCLATE 100 MG PO CAPS: 100.0000 mg | ORAL_CAPSULE | Freq: Two times a day (BID) | ORAL | 0 refills | Status: DC

## 2017-04-05 MED ORDER — IBUPROFEN 600 MG PO TABS: 600.0000 mg | ORAL_TABLET | Freq: Four times a day (QID) | ORAL | 0 refills | Status: DC | PRN

## 2017-04-05 MED ORDER — LIDOCAINE-EPINEPHRINE (PF) 2 %-1:200000 IJ SOLN
INTRAMUSCULAR | Status: AC
  Filled 2017-04-05: qty 10

## 2017-04-05 MED ORDER — LIDOCAINE-EPINEPHRINE (PF) 2 %-1:200000 IJ SOLN
10.0000 mL | Freq: Once | INTRAMUSCULAR | Status: AC
  Administered 2017-04-05: 10 mL via INTRADERMAL

## 2017-04-05 MED ORDER — TETANUS-DIPHTH-ACELL PERTUSSIS 5-2.5-18.5 LF-MCG/0.5 IM SUSP
0.5000 mL | Freq: Once | INTRAMUSCULAR | Status: AC
  Administered 2017-04-05: 0.5 mL via INTRAMUSCULAR
  Filled 2017-04-05: qty 0.5

## 2017-04-05 MED FILL — IBUPROFEN 600 MG TABLET: 600 | 8 days supply | Qty: 30 | Fill #0

## 2017-04-05 MED FILL — DOXYCYCLINE HYCLATE 100 MG: 100 | 7 days supply | Qty: 14 | Fill #0

## 2017-04-05 NOTE — Discharge Instructions (Signed)
Continue to apply warm compress to affected area several times daily.  Take antibiotic as prescribed for the full duration.  Return in 48 hrs for wound recheck and packing removal if you notice no improvement.

## 2017-04-05 NOTE — ED Triage Notes (Signed)
Abscess to his right axilla. Red, swollen, tender to touch.

## 2017-04-05 NOTE — ED Provider Notes (Signed)
[facility]   CSN: [id number] Arrival date & time: 04/05/17  1148     History   Chief Complaint Chief Complaint  Patient presents with  . Abscess    HPI Roger Salinas is a 26 y.o. male.  HPI   26 year old male presenting for evaluation of right armpit infection. For the past 3-4 days patient has noticed increasing pain and swelling to his right arm.. Pain is sharp throbbing moderate in severity. He also notice increased redness and warmth. Denies any associated fever, no chest pain. No recent insect bite. He is not a diabetic. He has history of having similar abscess that normally comes to head and eruption without any incision and drainage. He cannot recall last tetanus status.  History reviewed. No pertinent past medical history.  Patient Active Problem List   Diagnosis Date Noted  . Near syncope 09/01/2016  . Bigeminal rhythm 09/01/2016  . Hypokalemia 09/01/2016  . Hyponatremia 09/01/2016  . Bigeminy 09/01/2016  . Obesity 09/01/2016  . Right knee injury 11/22/2011    Past Surgical History:  Procedure Laterality Date  . TONSILLECTOMY         Home Medications    Prior to Admission medications   Medication Sig Start Date End Date Taking? Authorizing Provider  ibuprofen (ADVIL,MOTRIN) 200 MG tablet Take 200 mg by mouth every 6 (six) hours as needed for headache.    [provider]  oseltamivir (TAMIFLU) 75 MG capsule Take 1 capsule (75 mg total) by mouth 2 (two) times daily. 09/02/16   Alison Murray, MD    Family History Family History  Problem Relation Age of Onset  . Hypertension Mother   . Diabetes Neg Hx   . Heart attack Neg Hx   . Hyperlipidemia Neg Hx   . Sudden death Neg Hx     Social History Social History  Substance Use Topics  . Smoking status: Current Every Day Smoker    Packs/day: 1.00    Years: 10.00    Types: Cigarettes  . Smokeless tobacco: Never Used  . Alcohol use Yes     Allergies     Patient has no known allergies.   Review of Systems Review of Systems  Constitutional: Negative for fever.  Skin: Positive for rash.  Neurological: Negative for numbness.     Physical Exam Updated Vital Signs BP (!) 175/118   Pulse 96   Temp 98.6 F (37 C) (Oral)   Resp 18   Ht 6' (1.829 m)   Wt (!) 177.8 kg (392 lb)   SpO2 97%   BMI 53.16 kg/m   Physical Exam  Constitutional: He appears well-developed and well-nourished. No distress.  Obese male resting in bed in no acute discomfort.  HENT:  Head: Atraumatic.  Eyes: Conjunctivae are normal.  Neck: Neck supple.  Neurological: He is alert.  Skin: No rash noted.  R axillary fold: an area of induration and fluctuance approximately 4cm in diameter noted with surround skin erythema, ttp.    Psychiatric: He has a normal mood and affect.  Nursing note and vitals reviewed.    ED Treatments / Results  Labs (all labs ordered are listed, but only abnormal results are displayed) Labs Reviewed - No data to display  EKG  EKG Interpretation None       Radiology No results found.  Procedures .Marland KitchenIncision and Drainage Date/Time: 04/05/2017 2:40 PM Performed by: Fayrene Helper Authorized by: Fayrene Helper   Consent:    Consent obtained:  Verbal   Consent given by:  Patient   Risks discussed:  Incomplete drainage and infection   Alternatives discussed:  No treatment and alternative treatment Location:    Type:  Abscess   Size:  4   Location:  Upper extremity   Upper extremity location: R axillary fold. Pre-procedure details:    Skin preparation:  Betadine Anesthesia (see MAR for exact dosages):    Anesthesia method:  Local infiltration   Local anesthetic:  Lidocaine 2% WITH epi Procedure type:    Complexity:  Simple Procedure details:    Needle aspiration: no     Incision types:  Stab incision   Incision depth:  Subcutaneous   Scalpel blade:  11   Wound management:  Probed and deloculated   Drainage:   Purulent   Drainage amount:  Moderate   Wound treatment:  Wound left open   Packing materials:  1/2 in gauze Post-procedure details:    Patient tolerance of procedure:  Tolerated well, no immediate complications   (including critical care time)  Medications Ordered in ED Medications  lidocaine-EPINEPHrine (XYLOCAINE W/EPI) 2 %-1:200000 (PF) injection 10 mL (10 mLs Intradermal Given by Other 04/05/17 1328)     Initial Impression / Assessment and Plan / ED Course  I have reviewed the triage vital signs and the nursing notes.  Pertinent labs & imaging results that were available during my care of the patient were reviewed by me and considered in my medical decision making (see chart for details).     BP (!) 175/118   Pulse 96   Temp 98.6 F (37 C) (Oral)   Resp 18   Ht 6' (1.829 m)   Wt (!) 177.8 kg (392 lb)   SpO2 97%   BMI 53.16 kg/m  The patient was noted to be hypertensive today in the emergency department. I have spoken with the patient regarding hypertension and the need for improved management. I instructed the patient to followup with the Primary care doctor within 4 days to improve the management of the patient's hypertension. I also counseled the patient regarding the signs and symptoms which would require an emergent visit to an emergency department for hypertensive urgency and/or hypertensive emergency. The patient understood the need for improved hypertensive management.   Final Clinical Impressions(s) / ED Diagnoses   Final diagnoses:  Abscess of axilla, right    New Prescriptions New Prescriptions   DOXYCYCLINE (VIBRAMYCIN) 100 MG CAPSULE    Take 1 capsule (100 mg total) by mouth 2 (two) times daily. One po bid x 7 days   IBUPROFEN (ADVIL,MOTRIN) 600 MG TABLET    Take 1 tablet (600 mg total) by mouth every 6 (six) hours as needed.   2:42 PM Pt has abscess to R axillary fold.  I&D performed.  Pt placed on abx.  tdap updated.  Cellulitis skin changes noted.  No  systemic manifestation.  Recommend return in 48 hrs for recheck.    Fayrene Helper, PA-C 04/05/17 1452    Tilden Fossa, MD 04/06/17 [phone]

## 2017-04-07 ENCOUNTER — Encounter (HOSPITAL_BASED_OUTPATIENT_CLINIC_OR_DEPARTMENT_OTHER): Payer: Self-pay | Admitting: Emergency Medicine

## 2017-04-07 ENCOUNTER — Emergency Department (HOSPITAL_BASED_OUTPATIENT_CLINIC_OR_DEPARTMENT_OTHER)
Admission: EM | Admit: 2017-04-07 | Discharge: 2017-04-07 | Disposition: A | Discharge status: Home / Self Care | Payer: BLUE CROSS/BLUE SHIELD | Attending: Emergency Medicine | Admitting: Emergency Medicine

## 2017-04-07 DIAGNOSIS — L02411 Cutaneous abscess of right axilla: Secondary | ICD-10-CM | POA: Insufficient documentation

## 2017-04-07 DIAGNOSIS — F17210 Nicotine dependence, cigarettes, uncomplicated: Secondary | ICD-10-CM | POA: Insufficient documentation

## 2017-04-07 DIAGNOSIS — Z4800 Encounter for change or removal of nonsurgical wound dressing: Secondary | ICD-10-CM | POA: Diagnosis not present

## 2017-04-07 DIAGNOSIS — Z5189 Encounter for other specified aftercare: Secondary | ICD-10-CM

## 2017-04-07 MED ORDER — TRAMADOL HCL 50 MG PO TABS: 50.0000 mg | ORAL_TABLET | Freq: Four times a day (QID) | ORAL | 0 refills | Status: DC | PRN

## 2017-04-07 NOTE — ED Notes (Addendum)
Pt states noticing abnormal spot on skin 3 days ago and came to [facility] on Friday. Wound was cut, drained, and packed on Friday visit. Pt was given antibiotic. Pt is back to have it re-checked today. Pt states pain is worse today than it was on Friday. Pt reports purulent/bloody drainage. He is taking antibiotics as prescribed.   Area surrounding abscess is red and raised.

## 2017-04-07 NOTE — ED Provider Notes (Addendum)
[facility]   CSN: [id number] Arrival date & time: 04/07/17  1029     History   Chief Complaint Chief Complaint  Patient presents with  . Wound Check    HPI Roger Salinas is a 26 y.o. male.  HPI Patient presents to the emergency department for a recheck of his abscess.  The patient had an abscess history 2 days ago.  He states that there is still significant pain, but no other symptoms.  Patient states that the area.  Still continues drain a fair amount.  History reviewed. No pertinent past medical history.  Patient Active Problem List   Diagnosis Date Noted  . Near syncope 09/01/2016  . Bigeminal rhythm 09/01/2016  . Hypokalemia 09/01/2016  . Hyponatremia 09/01/2016  . Bigeminy 09/01/2016  . Obesity 09/01/2016  . Right knee injury 11/22/2011    Past Surgical History:  Procedure Laterality Date  . TONSILLECTOMY         Home Medications    Prior to Admission medications   Medication Sig Start Date End Date Taking? Authorizing Provider  doxycycline (VIBRAMYCIN) 100 MG capsule Take 1 capsule (100 mg total) by mouth 2 (two) times daily. One po bid x 7 days 04/05/17   Fayrene Helper, PA-C  ibuprofen (ADVIL,MOTRIN) 600 MG tablet Take 1 tablet (600 mg total) by mouth every 6 (six) hours as needed. 04/05/17   Fayrene Helper, PA-C  oseltamivir (TAMIFLU) 75 MG capsule Take 1 capsule (75 mg total) by mouth 2 (two) times daily. 09/02/16   Alison Murray, MD    Family History Family History  Problem Relation Age of Onset  . Hypertension Mother   . Diabetes Neg Hx   . Heart attack Neg Hx   . Hyperlipidemia Neg Hx   . Sudden death Neg Hx     Social History Social History  Substance Use Topics  . Smoking status: Current Every Day Smoker    Packs/day: 1.00    Years: 10.00    Types: Cigarettes  . Smokeless tobacco: Never Used  . Alcohol use Yes     Allergies   Patient has no known allergies.   Review of Systems Review of Systems All  other systems negative except as documented in the HPI. All pertinent positives and negatives as reviewed in the HPI.  Physical Exam Updated Vital Signs BP (!) 160/114 (BP Location: Left Arm)   Pulse 86   Temp 98 F (36.7 C) (Oral)   Resp 20   Ht 6' (1.829 m)   Wt (!) 177.8 kg (392 lb)   SpO2 98%   BMI 53.16 kg/m   Physical Exam  Constitutional: He is oriented to person, place, and time. He appears well-developed and well-nourished. No distress.  Neurological: He is alert and oriented to person, place, and time.  Skin: Skin is warm.  Patient has an open wound in the right axilla with packing in place.  There is mild redness surrounding the area     ED Treatments / Results  Labs (all labs ordered are listed, but only abnormal results are displayed) Labs Reviewed - No data to display  EKG  EKG Interpretation None       Radiology No results found.  Procedures Procedures (including critical care time)  Medications Ordered in ED Medications - No data to display   Initial Impression / Assessment and Plan / ED Course  I have reviewed the triage vital signs and the nursing notes.  Pertinent labs & imaging  results that were available during my care of the patient were reviewed by me and considered in my medical decision making (see chart for details).   packing was pulled. The patient is advised to use warm compresses around the area.  Keep area clean and dry, keep it covered as well.  Also advised again soaked in warm tub with Epsom salts.  Return for any worsening in his condition  Final Clinical Impressions(s) / ED Diagnoses   Final diagnoses:  None    New Prescriptions New Prescriptions   No medications on file     Kyra Manges 04/07/17 1203    Alvira Monday, MD 04/08/17 2315    Charlestine Night, PA-C 04/16/17 2214    Alvira Monday, MD 04/21/17 [phone]

## 2017-04-07 NOTE — Discharge Instructions (Signed)
Return here as needed.  Use warm compresses around the area.  Keep the area covered.  Keep area clean and dry

## 2017-04-07 NOTE — ED Triage Notes (Signed)
Pt here for recheck I&D RT axilla

## 2017-04-12 ENCOUNTER — Encounter (HOSPITAL_BASED_OUTPATIENT_CLINIC_OR_DEPARTMENT_OTHER): Payer: Self-pay | Admitting: Emergency Medicine

## 2017-04-12 ENCOUNTER — Emergency Department (HOSPITAL_BASED_OUTPATIENT_CLINIC_OR_DEPARTMENT_OTHER)
Admission: EM | Admit: 2017-04-12 | Discharge: 2017-04-12 | Disposition: A | Discharge status: Home / Self Care | Payer: BLUE CROSS/BLUE SHIELD | Attending: Emergency Medicine | Admitting: Emergency Medicine

## 2017-04-12 ENCOUNTER — Telehealth (HOSPITAL_BASED_OUTPATIENT_CLINIC_OR_DEPARTMENT_OTHER): Payer: Self-pay | Admitting: Emergency Medicine

## 2017-04-12 DIAGNOSIS — F17210 Nicotine dependence, cigarettes, uncomplicated: Secondary | ICD-10-CM | POA: Diagnosis not present

## 2017-04-12 DIAGNOSIS — Z5189 Encounter for other specified aftercare: Secondary | ICD-10-CM

## 2017-04-12 DIAGNOSIS — Z4800 Encounter for change or removal of nonsurgical wound dressing: Secondary | ICD-10-CM | POA: Insufficient documentation

## 2017-04-12 NOTE — ED Triage Notes (Signed)
Patient states that he has been here 2 times for an abcess under his right arm. Reports that he finished his antibiotics today, and the "mass" under his arm is still large and draining. He is worried.

## 2017-04-12 NOTE — ED Provider Notes (Signed)
[facility]   CSN: [id number] Arrival date & time: 04/12/17  1145     History   Chief Complaint Chief Complaint  Patient presents with  . Wound Check    HPI Roger Salinas is a 26 y.o. male.  HPI  26 year old male presenting for wound check. Recent evaluations with incision and drainage abscess in the right axilla. Finished antibiotics. He is currently concerned he still has a persistent lump. This pain has been improving. No further drainage. No fevers or chills.  History reviewed. No pertinent past medical history.  Patient Active Problem List   Diagnosis Date Noted  . Near syncope 09/01/2016  . Bigeminal rhythm 09/01/2016  . Hypokalemia 09/01/2016  . Hyponatremia 09/01/2016  . Bigeminy 09/01/2016  . Obesity 09/01/2016  . Right knee injury 11/22/2011    Past Surgical History:  Procedure Laterality Date  . TONSILLECTOMY         Home Medications    Prior to Admission medications   Medication Sig Start Date End Date Taking? Authorizing Provider  doxycycline (VIBRAMYCIN) 100 MG capsule Take 1 capsule (100 mg total) by mouth 2 (two) times daily. One po bid x 7 days 04/05/17   Fayrene Helper, PA-C  ibuprofen (ADVIL,MOTRIN) 600 MG tablet Take 1 tablet (600 mg total) by mouth every 6 (six) hours as needed. 04/05/17   Fayrene Helper, PA-C  oseltamivir (TAMIFLU) 75 MG capsule Take 1 capsule (75 mg total) by mouth 2 (two) times daily. 09/02/16   Alison Murray, MD  traMADol (ULTRAM) 50 MG tablet Take 1 tablet (50 mg total) by mouth every 6 (six) hours as needed for severe pain. 04/07/17   Charlestine Night, PA-C    Family History Family History  Problem Relation Age of Onset  . Hypertension Mother   . Diabetes Neg Hx   . Heart attack Neg Hx   . Hyperlipidemia Neg Hx   . Sudden death Neg Hx     Social History Social History  Substance Use Topics  . Smoking status: Current Every Day Smoker    Packs/day: 1.00    Years: 10.00    Types:  Cigarettes  . Smokeless tobacco: Never Used  . Alcohol use Yes     Allergies   Patient has no known allergies.   Review of Systems Review of Systems  All systems reviewed and negative, other than as noted in HPI.  Physical Exam Updated Vital Signs BP (!) 164/109 (BP Location: Left Arm)   Pulse 96   Temp 98.2 F (36.8 C) (Oral)   Resp 20   Ht 6' (1.829 m)   Wt (!) 177.8 kg (392 lb)   SpO2 98%   BMI 53.16 kg/m   Physical Exam  Constitutional: He appears well-developed and well-nourished. No distress.  HENT:  Head: Normocephalic and atraumatic.  Eyes: Conjunctivae are normal. Right eye exhibits no discharge. Left eye exhibits no discharge.  Neck: Neck supple.  Cardiovascular: Normal rate, regular rhythm and normal heart sounds.  Exam reveals no gallop and no friction rub.   No murmur heard. Pulmonary/Chest: Effort normal and breath sounds normal. No respiratory distress.  Abdominal: Soft. He exhibits no distension. There is no tenderness.  Musculoskeletal: He exhibits no edema or tenderness.  Neurological: He is alert.  Skin: Skin is warm and dry.  Healing wound right axilla. Evidence of recent incision and drainage. No active drainage currently. Very minimal tenderness. There is indurated subcutaneous lesion. Overall, his exam is consistent with a resolving/healing  abscess.  Psychiatric: He has a normal mood and affect. His behavior is normal. Thought content normal.  Nursing note and vitals reviewed.    ED Treatments / Results  Labs (all labs ordered are listed, but only abnormal results are displayed) Labs Reviewed - No data to display  EKG  EKG Interpretation None       Radiology No results found.  Procedures Procedures (including critical care time)  Medications Ordered in ED Medications - No data to display   Initial Impression / Assessment and Plan / ED Course  I have reviewed the triage vital signs and the nursing notes.  Pertinent labs &  imaging results that were available during my care of the patient were reviewed by me and considered in my medical decision making (see chart for details).     26yM with wound check. Appears to be going through expected course of healing. No drainage. Pain improving. Advised that palpable mass he feels below skin should progressively get smaller but may take several weeks. Continue to observe. Return precautions discussed.   Final Clinical Impressions(s) / ED Diagnoses   Final diagnoses:  Visit for wound check    New Prescriptions New Prescriptions   No medications on file     Raeford RazorKohut, Saphire Barnhart, MD 04/22/17 1007

## 2017-04-12 NOTE — Discharge Instructions (Signed)
You wound appears to be healing appropriately. It may take several weeks for the lump you are feeling to go down and you may be left with a small amount of granulation tissue that you can always feel. Your symptoms should get progressively better. If you feel like the swelling, pain, redness is getting worse then you need to be rechecked.

## 2017-04-16 ENCOUNTER — Telehealth: Payer: Self-pay

## 2017-04-17 ENCOUNTER — Encounter: Payer: Self-pay | Admitting: Family Medicine

## 2017-04-17 ENCOUNTER — Ambulatory Visit (INDEPENDENT_AMBULATORY_CARE_PROVIDER_SITE_OTHER): Payer: BLUE CROSS/BLUE SHIELD | Admitting: Family Medicine

## 2017-04-17 VITALS — BP 160/120 | HR 85 | Temp 98.3°F | Ht 72.0 in | Wt 389.0 lb

## 2017-04-17 DIAGNOSIS — M25562 Pain in left knee: Secondary | ICD-10-CM

## 2017-04-17 DIAGNOSIS — Z131 Encounter for screening for diabetes mellitus: Secondary | ICD-10-CM | POA: Diagnosis not present

## 2017-04-17 DIAGNOSIS — M25561 Pain in right knee: Secondary | ICD-10-CM | POA: Diagnosis not present

## 2017-04-17 DIAGNOSIS — I10 Essential (primary) hypertension: Secondary | ICD-10-CM | POA: Diagnosis not present

## 2017-04-17 DIAGNOSIS — H5213 Myopia, bilateral: Secondary | ICD-10-CM | POA: Diagnosis not present

## 2017-04-17 DIAGNOSIS — G8929 Other chronic pain: Secondary | ICD-10-CM

## 2017-04-17 LAB — HEMOGLOBIN A1C: Hgb A1c MFr Bld: 6 % (ref 4.6–6.5)

## 2017-04-17 NOTE — Patient Instructions (Addendum)
Around 3 times per week, check your blood pressure 4 times per day. Twice in the morning and twice in the evening. The readings should be at least one minute apart. Write down these values and bring them to your next nurse visit/appointment.  When you check your BP, make sure you have been doing something calm/relaxing 5 minutes prior to checking. Both feet should be flat on the floor and you should be sitting. Use your left arm and make sure it is in a relaxed position (on a table), and that the cuff is at the approximate level/height of your heart.  Knee Exercises Ask your health care provider which exercises are safe for you. Do exercises exactly as told by your health care provider and adjust them as directed. It is normal to feel mild stretching, pulling, tightness, or discomfort as you do these exercises, but you should stop right away if you feel sudden pain or your pain gets worse.Do not begin these exercises until told by your health care provider. STRETCHING AND RANGE OF MOTION EXERCISES  These exercises warm up your muscles and joints and improve the movement and flexibility of your knee. These exercises also help to relieve pain, numbness, and tingling. Exercise A: Knee Extension, Prone  1. Lie on your abdomen on a bed. 2. Place your left / right knee just beyond the edge of the surface so your knee is not on the bed. You can put a towel under your left / right thigh just above your knee for comfort. 3. Relax your leg muscles and allow gravity to straighten your knee. You should feel a stretch behind your left / right knee. 4. Hold this position for 15-20 seconds. 5. Scoot up so your knee is supported between repetitions. Repeat 2-3 times. Complete this stretch 1 time a day. Exercise B: Knee Flexion, Active    1. Lie on your back with both knees straight. If this causes back discomfort, bend your left / right knee so your foot is flat on the floor. 2. Slowly slide your left / right  heel back toward your buttocks until you feel a gentle stretch in the front of your knee or thigh. 3. Hold this position for 15-20 seconds. 4. Slowly slide your left / right heel back to the starting position. Repeat 2-3 times. Complete this exercise 1 time a day. Exercise C: Quadriceps, Prone    1. Lie on your abdomen on a firm surface, such as a bed or padded floor. 2. Bend your left / right knee and hold your ankle. If you cannot reach your ankle or pant leg, loop a belt around your foot and grab the belt instead. 3. Gently pull your heel toward your buttocks. Your knee should not slide out to the side. You should feel a stretch in the front of your thigh and knee. 4. Hold this position for 15-20 seconds. Repeat 2-3 times. Complete this stretch 1 time a day. Exercise D: Hamstring, Supine  1. Lie on your back. 2. Loop a belt or towel over the ball of your left / right foot. The ball of your foot is on the walking surface, right under your toes. 3. Straighten your left / right knee and slowly pull on the belt to raise your leg until you feel a gentle stretch behind your knee. ? Do not let your left / right knee bend while you do this. ? Keep your other leg flat on the floor. 4. Hold this position for 15-20  seconds. Repeat 2-3 times. Complete this stretch 1 time a day. STRENGTHENING EXERCISES  These exercises build strength and endurance in your knee. Endurance is the ability to use your muscles for a long time, even after they get tired. Exercise E: Quadriceps, Isometric    1. Lie on your back with your left / right leg extended and your other knee bent. Put a rolled towel or small pillow under your knee if told by your health care provider. 2. Slowly tense the muscles in the front of your left / right thigh. You should see your kneecap slide up toward your hip or see increased dimpling just above the knee. This motion will push the back of the knee toward the floor. 3. For 15 seconds,  keep the muscle as tight as you can without increasing your pain. 4. Relax the muscles slowly and completely. Repeat 1 times. Complete this exercise every other day. Exercise F: Straight Leg Raises - Quadriceps  1. Lie on your back with your left / right leg extended and your other knee bent. 2. Tense the muscles in the front of your left / right thigh. You should see your kneecap slide up or see increased dimpling just above the knee. Your thigh may even shake a bit. 3. Keep these muscles tight as you raise your leg 4-6 inches (10-15 cm) off the floor. Do not let your knee bend. 4. Hold this position for 15 seconds. 5. Keep these muscles tense as you lower your leg. 6. Relax your muscles slowly and completely after each repetition. Repeat 2 times. Complete this exercise every other day.  Exercise G: Hamstring Curls    If told by your health care provider, do this exercise while wearing ankle weights. Begin with 5 lb weights (optional). Then increase the weight by 1 lb (0.5 kg) increments. Do not wear ankle weights that are more than 20 lbs to start with. 1. Lie on your abdomen with your legs straight. 2. Bend your left / right knee as far as you can without feeling pain. Keep your hips flat against the floor. 3. Hold this position for 10 seconds. 4. Slowly lower your leg to the starting position. Repeat 2 times. Complete this exercise every other day. Exercise H: Squats (Quadriceps)  1. Stand in front of a table, with your feet and knees pointing straight ahead. You may rest your hands on the table for balance but not for support. 2. Slowly bend your knees and lower your hips like you are going to sit in a chair. ? Keep your weight over your heels, not over your toes. ? Keep your lower legs upright so they are parallel with the table legs. ? Do not let your hips go lower than your knees. ? Do not bend lower than told by your health care provider. ? If your knee pain increases, do not  bend as low. 3. Hold the squat position for 10 seconds. 4. Slowly push with your legs to return to standing. Do not use your hands to pull yourself to standing. Repeat 2 times. Complete this exercise every otherday. Exercise I: Wall Slides (Quadriceps)    1. Lean your back against a smooth wall or door while you walk your feet out 18-24 inches (46-61 cm) from it. 2. Place your feet hip-width apart. 3. Slowly slide down the wall or door until your knees Repeat 2 times. Complete this exercise every other day. 4. Exercise K: Straight Leg Raises - Hip Abductors  1.  Lie on your side with your left / right leg in the top position. Lie so your head, shoulder, knee, and hip line up. You may bend your bottom knee to help you keep your balance. 2. Roll your hips slightly forward so your hips are stacked directly over each other and your left / right knee is facing forward. 3. Leading with your heel, lift your top leg 4-6 inches (10-15 cm). You should feel the muscles in your outer hip lifting. ? Do not let your foot drift forward. ? Do not let your knee roll toward the ceiling. 4. Hold this position for 10-15 seconds. 5. Slowly return your leg to the starting position. 6. Let your muscles relax completely after each repetition. Repeat 1-2 times. Complete this exercise every other. Exercise J: Straight Leg Raises - Hip Extensors  1. Lie on your abdomen on a firm surface. You can put a pillow under your hips if that is more comfortable. 2. Tense the muscles in your buttocks and lift your left / right leg about 4-6 inches (10-15 cm). Keep your knee straight as you lift your leg. 3. Hold this position for 10-15 seconds. 4. Slowly lower your leg to the starting position. 5. Let your leg relax completely after each repetition. Repeat 2 times. Complete this exercise every other day. This information is not intended to replace advice given to you by your health care provider. Make sure you discuss any  questions you have with your health care provider. Document Released: 06/13/2005 Document Revised: 04/23/2016 Document Reviewed: 06/05/2015 Elsevier Interactive Patient Education  2017 [employer]   DASH Eating Plan DASH stands for "Dietary Approaches to Stop Hypertension." The DASH eating plan is a healthy eating plan that has been shown to reduce high blood pressure (hypertension). It may also reduce your risk for type 2 diabetes, heart disease, and stroke. The DASH eating plan may also help with weight loss. What are tips for following this plan? General guidelines  Avoid eating more than 2,300 mg (milligrams) of salt (sodium) a day. If you have hypertension, you may need to reduce your sodium intake to 1,500 mg a day.  Limit alcohol intake to no more than 1 drink a day for nonpregnant women and 2 drinks a day for men. One drink equals 12 oz of beer, 5 oz of wine, or 1 oz of hard liquor.  Work with your health care provider to maintain a healthy body weight or to lose weight. Ask what an ideal weight is for you.  Get at least 30 minutes of exercise that causes your heart to beat faster (aerobic exercise) most days of the week. Activities may include walking, swimming, or biking.  Work with your health care provider or diet and nutrition specialist (dietitian) to adjust your eating plan to your individual calorie needs. Reading food labels  Check food labels for the amount of sodium per serving. Choose foods with less than 5 percent of the Daily Value of sodium. Generally, foods with less than 300 mg of sodium per serving fit into this eating plan.  To find whole grains, look for the word "whole" as the first word in the ingredient list. Shopping  Buy products labeled as "low-sodium" or "no salt added."  Buy fresh foods. Avoid canned foods and premade or frozen meals. Cooking  Avoid adding salt when cooking. Use salt-free seasonings or herbs instead of table salt or sea salt.  Check with your health care provider or pharmacist before using salt  substitutes.  Do not fry foods. Cook foods using healthy methods such as baking, boiling, grilling, and broiling instead.  Cook with heart-healthy oils, such as olive, canola, soybean, or sunflower oil. Meal planning   Eat a balanced diet that includes: ? 5 or more servings of fruits and vegetables each day. At each meal, try to fill half of your plate with fruits and vegetables. ? Up to 6-8 servings of whole grains each day. ? Less than 6 oz of lean meat, poultry, or fish each day. A 3-oz serving of meat is about the same size as a deck of cards. One egg equals 1 oz. ? 2 servings of low-fat dairy each day. ? A serving of nuts, seeds, or beans 5 times each week. ? Heart-healthy fats. Healthy fats called Omega-3 fatty acids are found in foods such as flaxseeds and coldwater fish, like sardines, salmon, and mackerel.  Limit how much you eat of the following: ? Canned or prepackaged foods. ? Food that is high in trans fat, such as fried foods. ? Food that is high in saturated fat, such as fatty meat. ? Sweets, desserts, sugary drinks, and other foods with added sugar. ? Full-fat dairy products.  Do not salt foods before eating.  Try to eat at least 2 vegetarian meals each week.  Eat more home-cooked food and less restaurant, buffet, and fast food.  When eating at a restaurant, ask that your food be prepared with less salt or no salt, if possible. What foods are recommended? The items listed may not be a complete list. Talk with your dietitian about what dietary choices are best for you. Grains Whole-grain or whole-wheat bread. Whole-grain or whole-wheat pasta. Brown rice. Orpah Cobbatmeal. Quinoa. Bulgur. Whole-grain and low-sodium cereals. Pita bread. Low-fat, low-sodium crackers. Whole-wheat flour tortillas. Vegetables Fresh or frozen vegetables (raw, steamed, roasted, or grilled). Low-sodium or reduced-sodium tomato and  vegetable juice. Low-sodium or reduced-sodium tomato sauce and tomato paste. Low-sodium or reduced-sodium canned vegetables. Fruits All fresh, dried, or frozen fruit. Canned fruit in natural juice (without added sugar). Meat and other protein foods Skinless chicken or [place]. Ground chicken or [place]. Pork with fat trimmed off. Fish and seafood. Egg whites. Dried beans, peas, or lentils. Unsalted nuts, nut butters, and seeds. Unsalted canned beans. Lean cuts of beef with fat trimmed off. Low-sodium, lean deli meat. Dairy Low-fat (1%) or fat-free (skim) milk. Fat-free, low-fat, or reduced-fat cheeses. Nonfat, low-sodium ricotta or cottage cheese. Low-fat or nonfat yogurt. Low-fat, low-sodium cheese. Fats and oils Soft margarine without trans fats. Vegetable oil. Low-fat, reduced-fat, or light mayonnaise and salad dressings (reduced-sodium). Canola, safflower, olive, soybean, and sunflower oils. Avocado. Seasoning and other foods Herbs. Spices. Seasoning mixes without salt. Unsalted popcorn and pretzels. Fat-free sweets. What foods are not recommended? The items listed may not be a complete list. Talk with your dietitian about what dietary choices are best for you. Grains Baked goods made with fat, such as croissants, muffins, or some breads. Dry pasta or rice meal packs. Vegetables Creamed or fried vegetables. Vegetables in a cheese sauce. Regular canned vegetables (not low-sodium or reduced-sodium). Regular canned tomato sauce and paste (not low-sodium or reduced-sodium). Regular tomato and vegetable juice (not low-sodium or reduced-sodium). Rosita FirePickles. Olives. Fruits Canned fruit in a light or heavy syrup. Fried fruit. Fruit in cream or butter sauce. Meat and other protein foods Fatty cuts of meat. Ribs. Fried meat. Tomasa BlaseBacon. Sausage. Bologna and other processed lunch meats. Salami. Fatback. Hotdogs. Bratwurst. Salted nuts and seeds. Canned  beans with added salt. Canned or smoked fish. Whole eggs or  egg yolks. Chicken or [place] with skin. Dairy Whole or 2% milk, cream, and half-and-half. Whole or full-fat cream cheese. Whole-fat or sweetened yogurt. Full-fat cheese. Nondairy creamers. Whipped toppings. Processed cheese and cheese spreads. Fats and oils Butter. Stick margarine. Lard. Shortening. Ghee. Bacon fat. Tropical oils, such as coconut, palm kernel, or palm oil. Seasoning and other foods Salted popcorn and pretzels. Onion salt, garlic salt, seasoned salt, table salt, and sea salt. Worcestershire sauce. Tartar sauce. Barbecue sauce. Teriyaki sauce. Soy sauce, including reduced-sodium. Steak sauce. Canned and packaged gravies. Fish sauce. Oyster sauce. Cocktail sauce. Horseradish that you find on the shelf. Ketchup. Mustard. Meat flavorings and tenderizers. Bouillon cubes. Hot sauce and Tabasco sauce. Premade or packaged marinades. Premade or packaged taco seasonings. Relishes. Regular salad dressings. Where to find more information:  National Heart, Lung, and Blood Institute: PopSteam.is  American Heart Association: www.heart.org Summary  The DASH eating plan is a healthy eating plan that has been shown to reduce high blood pressure (hypertension). It may also reduce your risk for type 2 diabetes, heart disease, and stroke.  With the DASH eating plan, you should limit salt (sodium) intake to 2,300 mg a day. If you have hypertension, you may need to reduce your sodium intake to 1,500 mg a day.  When on the DASH eating plan, aim to eat more fresh fruits and vegetables, whole grains, lean proteins, low-fat dairy, and heart-healthy fats.  Work with your health care provider or diet and nutrition specialist (dietitian) to adjust your eating plan to your individual calorie needs. This information is not intended to replace advice given to you by your health care provider. Make sure you discuss any questions you have with your health care provider. Document Released: 07/19/2011  Document Revised: 07/23/2016 Document Reviewed: 07/23/2016 Elsevier Interactive Patient Education  2017 [employer].

## 2017-04-17 NOTE — Progress Notes (Signed)
Pre visit review using our clinic review tool, if applicable. No additional management support is needed unless otherwise documented below in the visit note. 

## 2017-04-17 NOTE — Progress Notes (Signed)
Chief Complaint  Patient presents with  . Establish Care       New Patient Visit SUBJECTIVE: HPI: Roger Salinas is an 26 y.o.male who is being seen for establishing care.  The patient had not previously had a PCP in many years.  Hypertension Patient presents for hypertension follow up. He does not monitor home blood pressures. He is not on medications. Patient has these side effects of medication: none Mgm and Mgf HTN and uncles on mom's side; unknown on father's side He is sometimes adhering to a healthy diet overall. Exercise: nothing  Diabetes also runs in the family and he would like to be screened for that.  Lastly, he mentioned bilateral knee dislocation. Around 12 years ago, he was at camp and someone did a dance move where the knees bent and inverted. He try to do that and notice popping in both of his knees followed by swelling and pain. It resolves on its own after a couple weeks, however he was always told that he would be fine. This problem continues to linger. His kneecap and bones do not get stuck in certain positions. No bruising or swelling currently. He does not currently have pain either.  No Known Allergies  Past Medical History:  Diagnosis Date  . No known health problems      Past Surgical History:  Procedure Laterality Date  . TONSILLECTOMY     Social History   Social History  . Marital status: Married   Social History Main Topics  . Smoking status: Current Every Day Smoker    Packs/day: 1.00    Years: 10.00    Types: Cigarettes  . Smokeless tobacco: Never Used  . Alcohol use Yes  . Drug use: No   Family History  Problem Relation Age of Onset  . Hypertension Mother   . Diabetes Neg Hx   . Heart attack Neg Hx   . Hyperlipidemia Neg Hx   . Sudden death Neg Hx    Takes no medications routinely.  ROS Cardiovascular: Denies chest pain  Respiratory: Denies dyspnea   OBJECTIVE: BP (!) 160/120 (BP Location: Left Arm, Patient Position:  Sitting, Cuff Size: Large)   Pulse 85   Temp 98.3 F (36.8 C) (Oral)   Ht 6' (1.829 m)   Wt (!) 389 lb (176.4 kg)   SpO2 97%   BMI 52.76 kg/m   Constitutional: -  VS reviewed -  Well developed, well nourished, appears stated age -  No apparent distress  Psychiatric: -  Oriented to person, place, and time -  Memory intact -  Affect and mood normal -  Fluent conversation, good eye contact -  Judgment and insight age appropriate  Eye: -  Conjunctivae clear, no discharge -  Pupils symmetric, round, reactive to light  ENMT: -  MMM    Pharynx moist, no exudate, no erythema  Neck: -  No gross swelling, no palpable masses -  Thyroid midline, not enlarged, mobile, no palpable masses  Cardiovascular: -  RRR -  No LE edema  Respiratory: -  Normal respiratory effort, no accessory muscle use, no retraction -  Breath sounds equal, no wheezes, no ronchi, no crackles  Gastrointestinal: -  Large panniculus  Musculoskeletal: -  No clubbing, no cyanosis -  B/l knee- mild TTP over R patellar tendon, no other TTP b/l, no effusion, no excessive warmth  Skin: -  No significant lesion on inspection -  Warm and dry to palpation   ASSESSMENT/PLAN: Essential hypertension  Morbid obesity (HCC)  Screening for diabetes mellitus - Plan: Hemoglobin A1c  Counseled on diet and exercise. DASH eating plan given. Offer medication, however the patient would like to make myself changes first. Get home BP monitor. Check 3x/week, 4x/day and record.  Regarding knee, he would like to try some home stretches/exercises with intermittent ice. If no improvement, will consider physical therapy versus referring to sports medicine. Patient should return 4 weeks. The patient voiced understanding and agreement to the plan.   [name] MorrowWendling, DO 04/17/17  12:39 PM

## 2017-05-06 ENCOUNTER — Ambulatory Visit (HOSPITAL_BASED_OUTPATIENT_CLINIC_OR_DEPARTMENT_OTHER)
Admission: RE | Admit: 2017-05-06 | Discharge: 2017-05-06 | Disposition: A | Discharge status: Home / Self Care | Payer: BLUE CROSS/BLUE SHIELD | Source: Ambulatory Visit | Attending: Family Medicine | Admitting: Family Medicine

## 2017-05-06 ENCOUNTER — Encounter: Payer: Self-pay | Admitting: Family Medicine

## 2017-05-06 ENCOUNTER — Telehealth: Payer: Self-pay | Admitting: Family Medicine

## 2017-05-06 ENCOUNTER — Ambulatory Visit (INDEPENDENT_AMBULATORY_CARE_PROVIDER_SITE_OTHER): Payer: BLUE CROSS/BLUE SHIELD | Admitting: Family Medicine

## 2017-05-06 VITALS — BP 162/112 | HR 96 | Temp 98.7°F | Ht 72.0 in | Wt 394.5 lb

## 2017-05-06 DIAGNOSIS — I10 Essential (primary) hypertension: Secondary | ICD-10-CM

## 2017-05-06 DIAGNOSIS — R0602 Shortness of breath: Secondary | ICD-10-CM | POA: Diagnosis not present

## 2017-05-06 DIAGNOSIS — R0600 Dyspnea, unspecified: Secondary | ICD-10-CM | POA: Diagnosis not present

## 2017-05-06 DIAGNOSIS — R0681 Apnea, not elsewhere classified: Secondary | ICD-10-CM | POA: Diagnosis not present

## 2017-05-06 LAB — CBC
HCT: 42.3 % (ref 39.0–52.0)
Hemoglobin: 14 g/dL (ref 13.0–17.0)
MCHC: 33 g/dL (ref 30.0–36.0)
MCV: 80.9 fl (ref 78.0–100.0)
Platelets: 277 10*3/uL (ref 150.0–400.0)
RBC: 5.23 Mil/uL (ref 4.22–5.81)
RDW: 14.2 % (ref 11.5–15.5)
WBC: 12.1 10*3/uL — ABNORMAL HIGH (ref 4.0–10.5)

## 2017-05-06 LAB — COMPREHENSIVE METABOLIC PANEL
ALT: 25 U/L (ref 0–53)
AST: 14 U/L (ref 0–37)
Albumin: 4 g/dL (ref 3.5–5.2)
Alkaline Phosphatase: 117 U/L (ref 39–117)
BUN: 12 mg/dL (ref 6–23)
CO2: 31 mEq/L (ref 19–32)
Calcium: 9.3 mg/dL (ref 8.4–10.5)
Chloride: 104 mEq/L (ref 96–112)
Creatinine, Ser: 0.73 mg/dL (ref 0.40–1.50)
GFR: 135.68 mL/min (ref >60.00)
Glucose, Bld: 116 mg/dL — ABNORMAL HIGH (ref 70–99)
Potassium: 4.4 mEq/L (ref 3.5–5.1)
Sodium: 141 mEq/L (ref 135–145)
Total Bilirubin: 0.4 mg/dL (ref 0.2–1.2)
Total Protein: 7.3 g/dL (ref 6.0–8.3)

## 2017-05-06 MED ORDER — ALBUTEROL SULFATE HFA 108 (90 BASE) MCG/ACT IN AERS: 2.0000 | INHALATION_SPRAY | Freq: Four times a day (QID) | RESPIRATORY_TRACT | 0 refills | Status: DC | PRN

## 2017-05-06 MED ORDER — LISINOPRIL 20 MG PO TABS: 20.0000 mg | ORAL_TABLET | Freq: Every day | ORAL | 3 refills | Status: DC

## 2017-05-06 NOTE — Progress Notes (Signed)
Pre visit review using our clinic review tool, if applicable. No additional management support is needed unless otherwise documented below in the visit note. 

## 2017-05-06 NOTE — Progress Notes (Signed)
Chief Complaint  Patient presents with  . Shortness of Breath  . Cough    Roger Salinas here for SOB.  It started this morning at 8:00 (10:15 appt). He will have 1-2 minutes of difficulty catching his breath every 15 minutes. Nothing seems to trigger it including exertion. He denies any upper respiratory symptoms. No fevers. He did have some calf pain last night, worse on the left side right now. No recent travel or history of clots. He will have some nonproductive cough associated with it, however it is not every time he has a shortness of breath. No recent medication changes or incident that appeared to start the breathing difficulties. He is a former smoker and quit earlier in the month. He has not tried anything at home.  Hypertension Patient presents for hypertension follow up. He does monitor home blood pressures. Blood pressures ranging on average from 150-160's/low 100's. He opted to start w lifestyle mods last time, did not work well. He is sometimes adhering to a healthy diet overall. Exercise: none  ROS:  Const: Denies fevers HEENT: As noted in HPI Lungs: +SOB, +intermittent cough  Past Medical History:  Diagnosis Date  . No known health problems    Family History  Problem Relation Age of Onset  . Hypertension Mother   . Diabetes Neg Hx   . Heart attack Neg Hx   . Hyperlipidemia Neg Hx   . Sudden death Neg Hx     BP (!) 162/112 (BP Location: Left Arm, Patient Position: Sitting, Cuff Size: Large)   Pulse 96   Temp 98.7 F (37.1 C) (Oral)   Ht 6' (1.829 m)   Wt (!) 394 lb 8 oz (178.9 kg)   SpO2 97%   BMI 53.50 kg/m  General: Awake, alert, appears stated age HEENT: AT, [place], ears patent b/l and TM's neg, nares patent w/o discharge, pharynx pink and without exudates, MMM Neck: No masses or asymmetry Heart: RRR, no murmurs, no bruits Lungs: CTAB, no accessory muscle use MSK: +TTP in L medial calf, neg Homan's b/l, no TTP in R LE Psych: Age appropriate  judgment and insight, normal mood and affect  Dyspnea, unspecified type - Plan: DG Chest 2 View, CBC, Comprehensive metabolic panel, D-Dimer, Quantitative, albuterol (PROVENTIL HFA;VENTOLIN HFA) 108 (90 Base) MCG/ACT inhaler  Essential hypertension - Plan: lisinopril (PRINIVIL,ZESTRIL) 20 MG tablet, Comprehensive metabolic panel  Witnessed apneic spells - Plan: Ambulatory referral to Pulmonology  Orders as above.  Trial albuterol. He did have an episode while I was in the room and I did not hear any abn breath sounds. CXR looks neg, await final read.  Start lisinopril, check lytes today. Recheck Bp in 4 weeks. Refer sleep for witnessed apneic episodes by wife. Pt voiced understanding and agreement to the plan.  [name] [place], DO 05/06/17 11:55 AM

## 2017-05-06 NOTE — Patient Instructions (Addendum)
I will let you know the results of your labs via MyChart.  Your X-ray is normal of your chest unless you hear otherwise from [place].  If things get worse, go to the emergency department or call 911.   If you do not hear anything about your referral in the next 1-2 weeks, call our office and ask for an update.

## 2017-05-06 NOTE — Telephone Encounter (Signed)
Patient Name: Roger Salinas  DOB: 20-Dec-1990    Initial Comment Caller states he is having a hard time catching his breath about every 10 minutes.   Nurse Assessment  Nurse: Scarlette Ar, RN, Heather Date/Time (Eastern Time): 05/06/2017 9:10:50 AM  Confirm and document reason for call. If symptomatic, describe symptoms. ---Caller states he is having a hard time catching his breath about every 10 minutes. This started about an hour ago  Does the patient have any new or worsening symptoms? ---Yes  Will a triage be completed? ---Yes  Related visit to physician within the last 2 weeks? ---No  Does the PT have any chronic conditions? (i.e. diabetes, asthma, etc.) ---No  Is this a behavioral health or substance abuse call? ---No     Guidelines    Guideline Title Affirmed Question Affirmed Notes  Cough - Acute Non-Productive SEVERE coughing spells (e.g., whooping sound after coughing, vomiting after coughing)   Cough - Acute Non-Productive SEVERE coughing spells (e.g., whooping sound after coughing, vomiting after coughing)    Final Disposition User   See Physician within 24 Hours [place], RN, Research scientist (physical sciences)    Comments  Appt with Dr. Carmelia Roller today at 10:15 am   Referrals  REFERRED TO PCP OFFICE  REFERRED TO PCP OFFICE  REFERRED TO PCP OFFICE   Caller Disagree/Comply Comply  Caller Understands Yes  PreDisposition Call Doctor

## 2017-05-07 ENCOUNTER — Encounter: Payer: Self-pay | Admitting: Family Medicine

## 2017-05-07 LAB — D-DIMER, QUANTITATIVE: D-Dimer, Quant: 0.29 mcg/mL FEU (ref <0.50)

## 2017-05-07 MED ORDER — DOXYCYCLINE HYCLATE 100 MG PO TABS: 100.0000 mg | ORAL_TABLET | Freq: Two times a day (BID) | ORAL | 0 refills | Status: AC

## 2017-05-15 ENCOUNTER — Ambulatory Visit: Payer: BLUE CROSS/BLUE SHIELD | Admitting: Family Medicine

## 2017-05-22 DIAGNOSIS — H35033 Hypertensive retinopathy, bilateral: Secondary | ICD-10-CM | POA: Diagnosis not present

## 2017-05-27 ENCOUNTER — Encounter: Payer: Self-pay | Admitting: Family Medicine

## 2017-05-29 DIAGNOSIS — H35033 Hypertensive retinopathy, bilateral: Secondary | ICD-10-CM | POA: Diagnosis not present

## 2017-05-29 DIAGNOSIS — H4713 Papilledema associated with retinal disorder: Secondary | ICD-10-CM | POA: Diagnosis not present

## 2017-05-29 DIAGNOSIS — H35031 Hypertensive retinopathy, right eye: Secondary | ICD-10-CM | POA: Diagnosis not present

## 2017-05-29 DIAGNOSIS — H4711 Papilledema associated with increased intracranial pressure: Secondary | ICD-10-CM | POA: Diagnosis not present

## 2017-05-29 DIAGNOSIS — H35032 Hypertensive retinopathy, left eye: Secondary | ICD-10-CM | POA: Diagnosis not present

## 2017-05-29 DIAGNOSIS — H47022 Hemorrhage in optic nerve sheath, left eye: Secondary | ICD-10-CM | POA: Diagnosis not present

## 2017-05-29 DIAGNOSIS — H4712 Papilledema associated with decreased ocular pressure: Secondary | ICD-10-CM | POA: Diagnosis not present

## 2017-06-03 ENCOUNTER — Encounter: Payer: Self-pay | Admitting: Internal Medicine

## 2017-06-03 ENCOUNTER — Ambulatory Visit (INDEPENDENT_AMBULATORY_CARE_PROVIDER_SITE_OTHER): Payer: BLUE CROSS/BLUE SHIELD | Admitting: Internal Medicine

## 2017-06-03 VITALS — BP 124/80 | HR 91 | Ht 72.0 in | Wt >= 6400 oz

## 2017-06-03 DIAGNOSIS — G4733 Obstructive sleep apnea (adult) (pediatric): Secondary | ICD-10-CM

## 2017-06-03 HISTORY — DX: Obstructive sleep apnea (adult) (pediatric): G47.33

## 2017-06-03 NOTE — Patient Instructions (Signed)
Order- schedule unattended home sleep test     Dx OSA   Please call me about 2 weeks after your study, to ask for results and recommendations

## 2017-06-03 NOTE — Progress Notes (Signed)
06/03/17- 26 year old male former smoker for sleep evaluation.Sleep Consult; courtesy of Arva Chafe, DO. witnessed apenic episodes by wife-gasping for air. Never had sleep study. Medical problem list includes morbid obesity, bigeminal rhythm, hyponatremia Epworth 5 Wife tells him he snores loudly and gasps for breath and his sleep. He admits drowsiness if he sits quietly. Frequent waking at night. No sleep medicines, no caffeine. He denies parasomnias. ENT surgery-tonsils. Breathing is okay through his nose. Not aware of heart or lung problems except that he had bigeminy in the past. 2 uncles had sleep apnea.  Prior to Admission medications   Medication Sig Start Date End Date Taking? Authorizing Provider  lisinopril (PRINIVIL,ZESTRIL) 20 MG tablet Take 1 tablet (20 mg total) by mouth daily. 05/06/17  Yes Sharlene Dory, DO   Past Medical History:  Diagnosis Date  . No known health problems    Past Surgical History:  Procedure Laterality Date  . TONSILLECTOMY     Blood pressure 124/80, pulse 91, height 6' (1.829 m), weight (!) 400 lb 6.4 oz (181.6 kg), SpO2 95 %. \famh Social History   Social History  . Marital status: Married    Spouse name: N/A  . Number of children: N/A  . Years of education: N/A   Occupational History  . Not on file.   Social History Main Topics  . Smoking status: Former Smoker    Packs/day: 1.00    Years: 12.00    Types: Cigarettes    Start date: 2006    Quit date: 04/15/2017  . Smokeless tobacco: Never Used  . Alcohol use Yes     Comment: social-weekend use  . Drug use: No  . Sexual activity: Not on file   Other Topics Concern  . Not on file   Social History Narrative  . No narrative on file   ROS-see HPI   Negative unless "+" Constitutional:    weight loss, night sweats, fevers, chills, fatigue, lassitude. HEENT:    headaches, difficulty swallowing, tooth/dental problems, sore throat,       sneezing, itching, ear ache, nasal  congestion, post nasal drip, snoring CV:    chest pain, orthopnea, PND, swelling in lower extremities, anasarca,                                  dizziness, palpitations Resp:   +shortness of breath with exertion or at rest.                productive cough,   non-productive cough, coughing up of blood.              change in color of mucus.  wheezing.   Skin:    rash or lesions. GI:  No-   heartburn, indigestion, abdominal pain, nausea, vomiting, diarrhea,                 change in bowel habits, loss of appetite GU: dysuria, change in color of urine, no urgency or frequency.   flank pain. MS:   +joint pain, stiffness, decreased range of motion, back pain. Neuro-     nothing unusual Psych:  change in mood or affect.  depression or anxiety.   memory loss.  OBJ- Physical Exam General- Alert, Oriented, Affect-appropriate, Distress- none acute, + morbidly obese Skin- rash-none, lesions- none, excoriation- none, + tatoos Lymphadenopathy- none Head- atraumatic            Eyes- Gross vision  intact, PERRLA, conjunctivae and secretions clear            Ears- Hearing, canals-normal            Nose- Clear, no-Septal dev, mucus, polyps, erosion, perforation             Throat- Mallampati IV , mucosa clear , drainage- none, tonsils- atrophic, + own teeth Neck- flexible , trachea midline, no stridor , thyroid nl, carotid no bruit Chest - symmetrical excursion , unlabored           Heart/CV- RRR , no murmur , no gallop  , no rub, nl s1 s2                           - JVD- none , edema- none, stasis changes- none, varices- none           Lung- clear to P&A, wheeze- none, cough- none , dullness-none, rub- none           Chest wall-  Abd-  Br/ Gen/ Rectal- Not done, not indicated Extrem- cyanosis- none, clubbing, none, atrophy- none, strength- nl Neuro- grossly intact to observation

## 2017-06-03 NOTE — Assessment & Plan Note (Addendum)
He is very young to be so seriously overweight. Consider bariatric referral.

## 2017-06-03 NOTE — Assessment & Plan Note (Signed)
Tentative diagnosis, high probability based on history and exam. We discussed basic sleep hygiene, physiology of sleep apnea, important role of weight, his responsibility to drive safely, treatment options.  Plan-schedule sleep study. He will call for results and recommendations. I anticipate he will probably need CPAP

## 2017-06-05 ENCOUNTER — Ambulatory Visit (INDEPENDENT_AMBULATORY_CARE_PROVIDER_SITE_OTHER): Payer: BLUE CROSS/BLUE SHIELD | Admitting: Family Medicine

## 2017-06-05 ENCOUNTER — Encounter: Payer: Self-pay | Admitting: Family Medicine

## 2017-06-05 VITALS — BP 132/90 | HR 81 | Temp 97.7°F | Ht 72.0 in | Wt 399.1 lb

## 2017-06-05 DIAGNOSIS — I10 Essential (primary) hypertension: Secondary | ICD-10-CM | POA: Diagnosis not present

## 2017-06-05 MED ORDER — LISINOPRIL 40 MG PO TABS: 40.0000 mg | ORAL_TABLET | Freq: Every day | ORAL | 1 refills | Status: DC

## 2017-06-05 NOTE — Progress Notes (Signed)
Chief Complaint  Patient presents with  . Hypertension    Subjective Roger Salinas is a 26 y.o. male who presents for hypertension follow up. He does monitor home blood pressures. Blood pressures ranging from 130's/90's on average. He is cargely lompliant with medications. Patient has these side effects of medication: none He is not routinely adhering to a healthy diet overall. Current exercise: intermittent walking  Has personal and family hx of morbid obesity Uncle had bariatric surgery and lost around 400 lbs. His mother is undergoing the process now.  He is interested in my opinion regarding this procedure.   Past Medical History:  Diagnosis Date  . Obstructive sleep apnea 06/03/2017   Family History  Problem Relation Age of Onset  . Hypertension Mother   . Diabetes Mother   . Diabetes Maternal Uncle   . Diabetes Maternal Grandmother   . Diabetes Maternal Uncle   . Heart attack Neg Hx   . Hyperlipidemia Neg Hx   . Sudden death Neg Hx      Medications Current Outpatient Prescriptions on File Prior to Visit  Medication Sig Dispense Refill  . lisinopril (PRINIVIL,ZESTRIL) 20 MG tablet Take 1 tablet (20 mg total) by mouth daily. 90 tablet 3   Allergies No Known Allergies  Review of Systems Cardiovascular: no chest pain Respiratory:  no shortness of breath  Exam BP 132/90 (BP Location: Left Arm, Patient Position: Sitting, Cuff Size: Large)   Pulse 81   Temp 97.7 F (36.5 C) (Oral)   Ht 6' (1.829 m)   Wt (!) 399 lb 2 oz (181 kg)   SpO2 95%   BMI 54.13 kg/m  General:  well developed, well nourished, in no apparent distress Skin:  warm, no pallor or diaphoresis Eyes:  pupils equal and round, sclera anicteric without injection Heart: RRR, no murmurs, no bruits, no LE edema Lungs:  clear to auscultation, no accessory muscle use Psych: well oriented with normal range of affect and appropriate judgment/insight  Essential hypertension - Plan: lisinopril  (PRINIVIL,ZESTRIL) 40 MG tablet  Morbid obesity (HCC) - Plan: Amb Referral to Bariatric Surgery  Orders as above.  Increase dose of lisinopril from 20 mg daily to 40 mg daily. Counseled on diet and exercise -gave options regarding weight loss; he wishes to proceed with bariatric surgery, specifically [facility].  I also suggested seeing a nutritionist, continuing with home lifestyle modifications, and nonsurgical medical weight loss. F/u in 1 mo to recheck blood pressure. The patient voiced understanding and agreement to the plan.  [name] [place], DO 06/05/17  12:31 PM

## 2017-06-05 NOTE — Patient Instructions (Addendum)
If you do not hear anything about your referral in the next 1-2 weeks, call our office and ask for an update.  Keep checking BP at home.   Goal weight loss- 4 lbs.  Let us know if you need anything.

## 2017-06-05 NOTE — Progress Notes (Signed)
Pre visit review using our clinic review tool, if applicable. No additional management support is needed unless otherwise documented below in the visit note. 

## 2017-06-10 ENCOUNTER — Other Ambulatory Visit: Payer: Self-pay | Admitting: Family Medicine

## 2017-06-10 DIAGNOSIS — R0600 Dyspnea, unspecified: Secondary | ICD-10-CM

## 2017-06-10 MED ORDER — ALBUTEROL SULFATE HFA 108 (90 BASE) MCG/ACT IN AERS: 2.0000 | INHALATION_SPRAY | Freq: Four times a day (QID) | RESPIRATORY_TRACT | 2 refills | Status: DC | PRN

## 2017-06-11 NOTE — Telephone Encounter (Signed)
Completed.

## 2017-06-18 ENCOUNTER — Telehealth: Payer: Self-pay | Admitting: Family Medicine

## 2017-06-18 DIAGNOSIS — G4733 Obstructive sleep apnea (adult) (pediatric): Secondary | ICD-10-CM | POA: Diagnosis not present

## 2017-06-18 NOTE — Telephone Encounter (Signed)
Patient calling about a RX albuterol (PROVENTIL HFA;VENTOLIN HFA) 108 (90 Base) MCG/ACT inhaler that was sent in to the pharmacy. Patient stated that he was not aware of this RX and does not remember having a conversation about this. Patient wants to make sure that he should really be taking this before he picks this up at the pharmacy. Patient noticed that this Rx was sent to pharmacy a few days after his appointment. Patient stated that you can send him a response through his my chart. Please advise

## 2017-06-19 DIAGNOSIS — G4733 Obstructive sleep apnea (adult) (pediatric): Secondary | ICD-10-CM | POA: Diagnosis not present

## 2017-06-19 NOTE — Telephone Encounter (Signed)
This was called in to see if it would help his breathing issue. If he is not having breathing issues, he does not need to take it. Apologies for any confusion. TY.

## 2017-06-19 NOTE — Telephone Encounter (Signed)
Patient informed. 

## 2017-07-02 ENCOUNTER — Telehealth: Payer: Self-pay | Admitting: Internal Medicine

## 2017-07-02 DIAGNOSIS — Z6841 Body Mass Index (BMI) 40.0 and over, adult: Secondary | ICD-10-CM | POA: Diagnosis not present

## 2017-07-02 DIAGNOSIS — G4733 Obstructive sleep apnea (adult) (pediatric): Secondary | ICD-10-CM

## 2017-07-02 DIAGNOSIS — I10 Essential (primary) hypertension: Secondary | ICD-10-CM | POA: Diagnosis not present

## 2017-07-02 NOTE — Telephone Encounter (Signed)
CY have you received the results for HST yet? Please advise.

## 2017-07-02 NOTE — Addendum Note (Signed)
Addended by: Maurene CapesPOTTS, Arval Brandstetter M on: 07/02/2017 03:35 PM   Modules accepted: Orders

## 2017-07-02 NOTE — Telephone Encounter (Signed)
Spoke with patient. He is aware of results. Wishes to proceed with CPAP order.   Advised patient that I will go ahead and place order today. Nothing else needed at time of call.

## 2017-07-02 NOTE — Telephone Encounter (Signed)
Home Sleep Test-confirms severe obstructive sleep apnea, averaging 42 and apnea events per hour with drops in blood oxygen level.  The best treatment in this situation is CPAP therapy. Plan-recommend order new DME, new CPAP auto 5-20, mask of choice, supplies, humidifier, Air View   Dx OSA   Please make sure he has a return office appointment 31-90 days per [employer] regulations

## 2017-07-03 ENCOUNTER — Encounter: Payer: Self-pay | Admitting: Family Medicine

## 2017-07-03 ENCOUNTER — Ambulatory Visit: Payer: BLUE CROSS/BLUE SHIELD | Admitting: Family Medicine

## 2017-07-03 ENCOUNTER — Other Ambulatory Visit: Payer: Self-pay | Admitting: *Deleted

## 2017-07-03 VITALS — BP 140/100 | HR 92 | Temp 98.4°F | Ht 70.0 in | Wt 399.2 lb

## 2017-07-03 DIAGNOSIS — R05 Cough: Secondary | ICD-10-CM

## 2017-07-03 DIAGNOSIS — I10 Essential (primary) hypertension: Secondary | ICD-10-CM | POA: Insufficient documentation

## 2017-07-03 DIAGNOSIS — G4733 Obstructive sleep apnea (adult) (pediatric): Secondary | ICD-10-CM

## 2017-07-03 DIAGNOSIS — T464X5A Adverse effect of angiotensin-converting-enzyme inhibitors, initial encounter: Secondary | ICD-10-CM

## 2017-07-03 DIAGNOSIS — R058 Other specified cough: Secondary | ICD-10-CM

## 2017-07-03 HISTORY — DX: Essential (primary) hypertension: I10

## 2017-07-03 MED ORDER — AMLODIPINE BESYLATE-VALSARTAN 5-320 MG PO TABS: 1.0000 | ORAL_TABLET | Freq: Every day | ORAL | 2 refills | Status: DC

## 2017-07-03 NOTE — Progress Notes (Signed)
Pre visit review using our clinic review tool, if applicable. No additional management support is needed unless otherwise documented below in the visit note. 

## 2017-07-03 NOTE — Patient Instructions (Addendum)
Stick to a clean diet and staying physically active.  Continue checking blood pressures 3x/week, once per day.  Alternate morning and evening.  Stop lisinopril.   Goal weight loss: 4 lbs in the next 2 mo  Let us know if you need anything.

## 2017-07-03 NOTE — Progress Notes (Signed)
Chief Complaint  Patient presents with  . Follow-up    Subjective Roger Salinas is a 26 y.o. male who presents for hypertension follow up. He does monitor home blood pressures. Blood pressures ranging from 120-130's/90-95's on average. He is compliant with medications- lisinopril 40 mg daily. Patient has these side effects of medication: dry cough, no other URI symptoms He is adhering to a healthy diet overall. Current exercise: Starting to be more physically active Did see a bariatric surgeon recently   Past Medical History:  Diagnosis Date  . Essential hypertension 07/03/2017  . Obstructive sleep apnea 06/03/2017   Family History  Problem Relation Age of Onset  . Hypertension Mother   . Diabetes Mother   . Diabetes Maternal Uncle   . Diabetes Maternal Grandmother   . Diabetes Maternal Uncle   . Heart attack Neg Hx   . Hyperlipidemia Neg Hx   . Sudden death Neg Hx     Medications Current Outpatient Medications on File Prior to Visit  Medication Sig Dispense Refill  . albuterol (PROVENTIL HFA;VENTOLIN HFA) 108 (90 Base) MCG/ACT inhaler Inhale 2 puffs into the lungs every 6 (six) hours as needed for wheezing or shortness of breath. 1 Inhaler 2  . lisinopril (PRINIVIL,ZESTRIL) 40 MG tablet Take 1 tablet (40 mg total) by mouth daily. 90 tablet 1   Allergies No Known Allergies  Review of Systems Cardiovascular: no chest pain Respiratory:  no shortness of breath  Exam BP (!) 140/100 (BP Location: Left Arm, Patient Position: Sitting, Cuff Size: Large)   Pulse 92   Temp 98.4 F (36.9 C) (Oral)   Ht 5\' 10"  (1.778 m)   Wt (!) 399 lb 4 oz (181.1 kg)   SpO2 96%   BMI 57.29 kg/m  General:  well developed, well nourished, in no apparent distress Skin: warm, no pallor or diaphoresis Eyes: pupils equal and round, sclera anicteric without injection Heart: RRR, no bruits, no LE edema Lungs: clear to auscultation, no accessory muscle use Psych: well oriented with normal  range of affect and appropriate judgment/insight  Essential hypertension  ACE-inhibitor cough  Morbid obesity (HCC)  Start Exforge 5-320 mg daily, stop ACEi. Counseled on diet and exercise- goal wt loss of 4 lbs by next visit.  F/u in 2 mo with holidays coming up. The patient voiced understanding and agreement to the plan.  [name] [place], DO 07/03/17  3:11 PM

## 2017-07-10 DIAGNOSIS — H47022 Hemorrhage in optic nerve sheath, left eye: Secondary | ICD-10-CM | POA: Diagnosis not present

## 2017-07-10 DIAGNOSIS — H35032 Hypertensive retinopathy, left eye: Secondary | ICD-10-CM | POA: Diagnosis not present

## 2017-07-10 DIAGNOSIS — H4711 Papilledema associated with increased intracranial pressure: Secondary | ICD-10-CM | POA: Diagnosis not present

## 2017-07-10 DIAGNOSIS — H35031 Hypertensive retinopathy, right eye: Secondary | ICD-10-CM | POA: Diagnosis not present

## 2017-07-10 DIAGNOSIS — H4712 Papilledema associated with decreased ocular pressure: Secondary | ICD-10-CM | POA: Diagnosis not present

## 2017-07-10 DIAGNOSIS — H4713 Papilledema associated with retinal disorder: Secondary | ICD-10-CM | POA: Diagnosis not present

## 2017-07-10 DIAGNOSIS — H35033 Hypertensive retinopathy, bilateral: Secondary | ICD-10-CM | POA: Diagnosis not present

## 2017-07-11 DIAGNOSIS — G4733 Obstructive sleep apnea (adult) (pediatric): Secondary | ICD-10-CM | POA: Diagnosis not present

## 2017-07-11 DIAGNOSIS — I10 Essential (primary) hypertension: Secondary | ICD-10-CM | POA: Diagnosis not present

## 2017-07-11 DIAGNOSIS — Z6841 Body Mass Index (BMI) 40.0 and over, adult: Secondary | ICD-10-CM | POA: Diagnosis not present

## 2017-07-16 ENCOUNTER — Encounter: Payer: Self-pay | Admitting: Family Medicine

## 2017-07-17 ENCOUNTER — Other Ambulatory Visit: Payer: Self-pay | Admitting: Family Medicine

## 2017-07-17 DIAGNOSIS — F54 Psychological and behavioral factors associated with disorders or diseases classified elsewhere: Secondary | ICD-10-CM | POA: Diagnosis not present

## 2017-07-17 DIAGNOSIS — Z7189 Other specified counseling: Secondary | ICD-10-CM | POA: Diagnosis not present

## 2017-07-17 DIAGNOSIS — Z6841 Body Mass Index (BMI) 40.0 and over, adult: Secondary | ICD-10-CM | POA: Diagnosis not present

## 2017-07-17 MED ORDER — AMLODIPINE BESYLATE 5 MG PO TABS: 5.0000 mg | ORAL_TABLET | Freq: Every day | ORAL | 3 refills | Status: DC

## 2017-07-17 MED ORDER — VALSARTAN 320 MG PO TABS: 320.0000 mg | ORAL_TABLET | Freq: Every day | ORAL | 3 refills | Status: DC

## 2017-07-26 ENCOUNTER — Encounter: Payer: Self-pay | Admitting: Family Medicine

## 2017-07-26 ENCOUNTER — Other Ambulatory Visit: Payer: Self-pay | Admitting: Family Medicine

## 2017-07-26 MED ORDER — AMLODIPINE BESYLATE-VALSARTAN 5-320 MG PO TABS: 1.0000 | ORAL_TABLET | Freq: Every day | ORAL | 0 refills | Status: DC

## 2017-07-29 NOTE — Telephone Encounter (Signed)
Patient is scheduled to be seen 07-31-17

## 2017-07-31 ENCOUNTER — Encounter: Payer: Self-pay | Admitting: Family Medicine

## 2017-07-31 ENCOUNTER — Ambulatory Visit (INDEPENDENT_AMBULATORY_CARE_PROVIDER_SITE_OTHER): Payer: BLUE CROSS/BLUE SHIELD | Admitting: Family Medicine

## 2017-07-31 VITALS — BP 112/84 | HR 90 | Temp 97.9°F | Ht 70.0 in | Wt >= 6400 oz

## 2017-07-31 DIAGNOSIS — I10 Essential (primary) hypertension: Secondary | ICD-10-CM

## 2017-07-31 NOTE — Patient Instructions (Addendum)
Consider intermittent fasting. 16 hours with no calories, eating from 12-8 PM.   Let us know if you need anything.

## 2017-07-31 NOTE — Progress Notes (Signed)
Chief Complaint  Patient presents with  . Bariatric Pre-op    Subjective Roger Salinas is a 26 y.o. male who presents for hypertension follow up. He does monitor home blood pressures. Blood pressures ranging from 120's/80's on average. He is compliant with medications- Norvasc 5 mg/d, valsartan 320 mg/d. Patient has these side effects of medication: none He is sometimes adhering to a healthy diet overall. Current exercise: exercises from bariatric team Bariatric team told him insurance likes monthly wt checks for 1 year. He has appt next mo.  Concerned about ARB recall.    Past Medical History:  Diagnosis Date  . Essential hypertension 07/03/2017  . Obstructive sleep apnea 06/03/2017   Family History  Problem Relation Age of Onset  . Hypertension Mother   . Diabetes Mother   . Diabetes Maternal Uncle   . Diabetes Maternal Grandmother   . Diabetes Maternal Uncle   . Heart attack Neg Hx   . Hyperlipidemia Neg Hx   . Sudden death Neg Hx     Medications Current Outpatient Medications on File Prior to Visit  Medication Sig Dispense Refill  . amLODipine (NORVASC) 5 MG tablet Take 1 tablet (5 mg total) by mouth daily. 30 tablet 3  . amLODipine-valsartan (EXFORGE) 5-320 MG tablet Take 1 tablet by mouth daily. 90 tablet 0  . valsartan (DIOVAN) 320 MG tablet Take 1 tablet (320 mg total) by mouth daily. 30 tablet 3   Allergies No Known Allergies  Review of Systems Cardiovascular: no chest pain Respiratory:  no shortness of breath  Exam BP 112/84 (BP Location: Left Arm, Patient Position: Sitting, Cuff Size: Large)   Pulse 90   Temp 97.9 F (36.6 C) (Oral)   Ht 5\' 10"  (1.778 m)   Wt (!) 402 lb 4 oz (182.5 kg)   SpO2 97%   BMI 57.72 kg/m  General:  well developed, well nourished, in no apparent distress Skin: warm, no pallor or diaphoresis Eyes: pupils equal and round, sclera anicteric without injection Heart: RRR, no bruits, no LE edema Lungs: clear to auscultation,  no accessory muscle use Psych: well oriented with normal range of affect and appropriate judgment/insight  Essential hypertension  Morbid obesity (HCC)  Cont meds, check w pharmacy to see if lot he has is in question.  Counseled on diet and exercise F/u in 1 mo. The patient voiced understanding and agreement to the plan.  [name] [place], DO 07/31/17  3:22 PM

## 2017-07-31 NOTE — Progress Notes (Signed)
Pre visit review using our clinic review tool, if applicable. No additional management support is needed unless otherwise documented below in the visit note. 

## 2017-08-12 DIAGNOSIS — G4733 Obstructive sleep apnea (adult) (pediatric): Secondary | ICD-10-CM | POA: Diagnosis not present

## 2017-08-15 DIAGNOSIS — Z6841 Body Mass Index (BMI) 40.0 and over, adult: Secondary | ICD-10-CM | POA: Diagnosis not present

## 2017-08-15 DIAGNOSIS — I10 Essential (primary) hypertension: Secondary | ICD-10-CM | POA: Diagnosis not present

## 2017-08-15 DIAGNOSIS — E7800 Pure hypercholesterolemia, unspecified: Secondary | ICD-10-CM | POA: Diagnosis not present

## 2017-08-16 DIAGNOSIS — Z713 Dietary counseling and surveillance: Secondary | ICD-10-CM | POA: Diagnosis not present

## 2017-08-26 ENCOUNTER — Encounter: Payer: Self-pay | Admitting: Family Medicine

## 2017-08-26 ENCOUNTER — Ambulatory Visit: Payer: BLUE CROSS/BLUE SHIELD | Admitting: Family Medicine

## 2017-08-26 VITALS — BP 124/72 | HR 98 | Temp 98.4°F | Ht 70.0 in | Wt >= 6400 oz

## 2017-08-26 DIAGNOSIS — M722 Plantar fascial fibromatosis: Secondary | ICD-10-CM | POA: Diagnosis not present

## 2017-08-26 MED ORDER — DICLOFENAC SODIUM 2 % TD SOLN: TRANSDERMAL | 2 refills | Status: DC

## 2017-08-26 NOTE — Progress Notes (Signed)
Pre visit review using our clinic review tool, if applicable. No additional management support is needed unless otherwise documented below in the visit note. 

## 2017-08-26 NOTE — Patient Instructions (Addendum)
PowerStep insoles are good generic inserts to use.  Stretch your calves.  Continue losing weight.nwice  Let me know if you would like to have injections or see a specialist via MyChart.  Ice/cold pack over area for 10-15 min every 2-3 hours while awake.  Plantar Fasciitis Stretches/exercises Do exercises exactly as told by your health care provider and adjust them as directed. It is normal to feel mild stretching, pulling, tightness, or discomfort as you do these exercises, but you should stop right away if you feel sudden pain or your pain gets worse.   Stretching and range of motion exercises These exercises warm up your muscles and joints and improve the movement and flexibility of your foot. These exercises also help to relieve pain.  Exercise A: Plantar fascia stretch 1. Sit with your left / right leg crossed over your opposite knee. 2. Hold your heel with one hand with that thumb near your arch. With your other hand, hold your toes and gently pull them back toward the top of your foot. You should feel a stretch on the bottom of your toes or your foot or both. 3. Hold this stretch for 30 seconds. 4. Slowly release your toes and return to the starting position. Repeat 2 times. Complete this exercise 3 times per week.  Exercise B: Gastroc, standing 1. Stand with your hands against a wall. 2. Extend your left / right leg behind you, and bend your front knee slightly. 3. Keeping your heels on the floor and keeping your back knee straight, shift your weight toward the wall without arching your back. You should feel a gentle stretch in your left / right calf. 4. Hold this position for 30 seconds. Repeat 2 times. Complete this exercise 3 times a week. Exercise C: Soleus, standing 1. Stand with your hands against a wall. 2. Extend your left / right leg behind you, and bend your front knee slightly. 3. Keeping your heels on the floor, bend your back knee and slightly shift your weight  over the back leg. You should feel a gentle stretch deep in your calf. 4. Hold this position for 30 seconds. Repeat 2 times. Complete this exercise 3 times per week. Exercise D: Gastrocsoleus, standing 1. Stand with the ball of your left / right foot on a step. The ball of your foot is on the walking surface, right under your toes. 2. Keep your other foot firmly on the same step. 3. Hold onto the wall or a railing for balance. 4. Slowly lift your other foot, allowing your body weight to press your heel down over the edge of the step. You should feel a stretch in your left / right calf. 5. Hold this position for 30 seconds. 6. Return both feet to the step. 7. Repeat this exercise with a slight bend in your left / right knee. Repeat 2 times with your left / right knee straight and 2times with your left / right knee bent. Complete this exercise 3 times a week.  Balance exercise This exercise builds your balance and strength control of your arch to help take pressure off your plantar fascia. Exercise E: Single leg stand 1. Without shoes, stand near a railing or in a doorway. You may hold onto the railing or door frame as needed. 2. Stand on your left / right foot. Keep your big toe down on the floor and try to keep your arch lifted. Do not let your foot roll inward. 3. Hold this position for  30 seconds. 4. If this exercise is too easy, you can try it with your eyes closed or while standing on a pillow. Repeat 2 times. Complete this exercise 3 times per week. This information is not intended to replace advice given to you by your health care provider. Make sure you discuss any questions you have with your health care provider. Document Released: 07/30/2005 Document Revised: 04/03/2016 Document Reviewed: 06/13/2015 Elsevier Interactive Patient Education  2017 [employer].

## 2017-08-26 NOTE — Progress Notes (Signed)
Musculoskeletal Exam  Patient: Roger Salinas DOB: 09/14/1990  DOS: 08/26/2017  SUBJECTIVE:  Chief Complaint:   Chief Complaint  Patient presents with  . Follow-up    knot on bottom of left  and small knot on right foot.    Roger Salinas is a 27 y.o.  male for evaluation and treatment of b/l foot pain.   Onset: Several months, worsening over past several days Location: Bottom of both feet Character:  sharp  Progression of issue:  is moderately worse Associated symptoms: Knots on bottom of feet Treatment: to date has been none.   Neurovascular symptoms: no  ROS: Musculoskeletal/Extremities: +b/l foot pain  Past Medical History:  Diagnosis Date  . Essential hypertension 07/03/2017  . Obstructive sleep apnea 06/03/2017    Objective: VITAL SIGNS: BP 124/72 (BP Location: Left Arm, Patient Position: Sitting, Cuff Size: Large)   Pulse 98   Temp 98.4 F (36.9 C) (Oral)   Ht 5\' 10"  (1.778 m)   Wt (!) 401 lb (181.9 kg)   SpO2 96%   BMI 57.54 kg/m  Constitutional: Well formed, well developed. No acute distress. Cardiovascular: Brisk cap refill Thorax & Lungs: No accessory muscle use Musculoskeletal: Feet.   Tenderness to palpation: Yes over plantar fascia b/l around 3-4 cm distal to prox insertion b/l Deformity: yes, soft tissue lump noted, larger on L Ecchymosis: no Neurologic: Normal sensory function.  Psychiatric: Normal mood. Age appropriate judgment and insight. Alert & oriented x 3.    Assessment:  Plantar fasciitis - Plan: Diclofenac Sodium (PENNSAID) 2 % SOLN  Plan: Orders as above. Ice. Stretches/exercises.  Injections vs refer podiatry if no better. F/u prn for this issue. The patient voiced understanding and agreement to the plan.   [name] [place], DO 08/26/17  4:31 PM

## 2017-08-27 ENCOUNTER — Telehealth: Payer: Self-pay | Admitting: Family Medicine

## 2017-08-27 ENCOUNTER — Encounter: Payer: Self-pay | Admitting: Family Medicine

## 2017-08-27 DIAGNOSIS — I10 Essential (primary) hypertension: Secondary | ICD-10-CM

## 2017-08-27 NOTE — Telephone Encounter (Signed)
Clarified that max was 4 pumps daily. Spoke to the pharmacist and informed ok for instructions to state apply 1 pump thin layer over feet 4 times per day as needed. They will now be able to complete/send in prescription.

## 2017-08-27 NOTE — Telephone Encounter (Signed)
Copied from CRM [id number]. Topic: Quick Communication - See Telephone Encounter >> Aug 27, 2017 10:51 AM Oneal GroutSebastian, Jennifer S wrote: CRM for notification. See Telephone encounter for:  Pharmacy has a question on Diclofenac Sodium (PENNSAID) 2 % SOLN, need more details on directions. Meds is in bottle that has a pump, standard dose is 2 pumps 2 times a day. Please advise.

## 2017-08-28 MED ORDER — AMLODIPINE BESYLATE 5 MG PO TABS: 5.0000 mg | ORAL_TABLET | Freq: Every day | ORAL | 3 refills | Status: DC

## 2017-08-28 NOTE — Telephone Encounter (Signed)
Patient's prescription has been sent to local pharmacy.

## 2017-09-02 ENCOUNTER — Encounter (HOSPITAL_BASED_OUTPATIENT_CLINIC_OR_DEPARTMENT_OTHER): Payer: Self-pay

## 2017-09-02 ENCOUNTER — Observation Stay (HOSPITAL_BASED_OUTPATIENT_CLINIC_OR_DEPARTMENT_OTHER)
Admission: EM | Admit: 2017-09-02 | Discharge: 2017-09-04 | Disposition: A | Discharge status: Home / Self Care | Payer: BLUE CROSS/BLUE SHIELD | Attending: Surgery | Admitting: Surgery

## 2017-09-02 ENCOUNTER — Other Ambulatory Visit: Payer: Self-pay

## 2017-09-02 ENCOUNTER — Emergency Department (HOSPITAL_BASED_OUTPATIENT_CLINIC_OR_DEPARTMENT_OTHER): Payer: BLUE CROSS/BLUE SHIELD

## 2017-09-02 ENCOUNTER — Ambulatory Visit: Payer: Self-pay

## 2017-09-02 DIAGNOSIS — R008 Other abnormalities of heart beat: Secondary | ICD-10-CM | POA: Insufficient documentation

## 2017-09-02 DIAGNOSIS — K3533 Acute appendicitis with perforation and localized peritonitis, with abscess: Secondary | ICD-10-CM | POA: Diagnosis not present

## 2017-09-02 DIAGNOSIS — R55 Syncope and collapse: Secondary | ICD-10-CM | POA: Insufficient documentation

## 2017-09-02 DIAGNOSIS — G4733 Obstructive sleep apnea (adult) (pediatric): Secondary | ICD-10-CM | POA: Insufficient documentation

## 2017-09-02 DIAGNOSIS — Z79899 Other long term (current) drug therapy: Secondary | ICD-10-CM | POA: Insufficient documentation

## 2017-09-02 DIAGNOSIS — Z833 Family history of diabetes mellitus: Secondary | ICD-10-CM | POA: Insufficient documentation

## 2017-09-02 DIAGNOSIS — Z6841 Body Mass Index (BMI) 40.0 and over, adult: Secondary | ICD-10-CM | POA: Insufficient documentation

## 2017-09-02 DIAGNOSIS — Z8249 Family history of ischemic heart disease and other diseases of the circulatory system: Secondary | ICD-10-CM | POA: Insufficient documentation

## 2017-09-02 DIAGNOSIS — K37 Unspecified appendicitis: Secondary | ICD-10-CM | POA: Diagnosis not present

## 2017-09-02 DIAGNOSIS — Z87891 Personal history of nicotine dependence: Secondary | ICD-10-CM | POA: Diagnosis not present

## 2017-09-02 DIAGNOSIS — M722 Plantar fascial fibromatosis: Secondary | ICD-10-CM | POA: Insufficient documentation

## 2017-09-02 DIAGNOSIS — R1031 Right lower quadrant pain: Secondary | ICD-10-CM

## 2017-09-02 DIAGNOSIS — I10 Essential (primary) hypertension: Secondary | ICD-10-CM | POA: Diagnosis not present

## 2017-09-02 DIAGNOSIS — R109 Unspecified abdominal pain: Secondary | ICD-10-CM | POA: Diagnosis present

## 2017-09-02 LAB — CBC WITH DIFFERENTIAL/PLATELET
Basophils Absolute: 0 10*3/uL (ref 0.0–0.1)
Basophils Relative: 0 %
Eosinophils Absolute: 0.2 10*3/uL (ref 0.0–0.7)
Eosinophils Relative: 2 %
HCT: 38.3 % — ABNORMAL LOW (ref 39.0–52.0)
Hemoglobin: 13.1 g/dL (ref 13.0–17.0)
Lymphocytes Relative: 26 %
Lymphs Abs: 2.6 10*3/uL (ref 0.7–4.0)
MCH: 27.4 pg (ref 26.0–34.0)
MCHC: 34.2 g/dL (ref 30.0–36.0)
MCV: 80.1 fL (ref 78.0–100.0)
Monocytes Absolute: 0.5 10*3/uL (ref 0.1–1.0)
Monocytes Relative: 5 %
Neutro Abs: 6.7 10*3/uL (ref 1.7–7.7)
Neutrophils Relative %: 67 %
Platelets: 279 10*3/uL (ref 150–400)
RBC: 4.78 MIL/uL (ref 4.22–5.81)
RDW: 13.5 % (ref 11.5–15.5)
WBC: 10 10*3/uL (ref 4.0–10.5)

## 2017-09-02 LAB — COMPREHENSIVE METABOLIC PANEL
ALT: 35 U/L (ref 17–63)
AST: 21 U/L (ref 15–41)
Albumin: 4 g/dL (ref 3.5–5.0)
Alkaline Phosphatase: 107 U/L (ref 38–126)
Anion gap: 6 (ref 5–15)
BUN: 19 mg/dL (ref 6–20)
CO2: 29 mmol/L (ref 22–32)
Calcium: 9.5 mg/dL (ref 8.9–10.3)
Chloride: 101 mmol/L (ref 101–111)
Creatinine, Ser: 0.94 mg/dL (ref 0.61–1.24)
GFR calc Af Amer: >60 mL/min (ref >60)
GFR calc non Af Amer: >60 mL/min (ref >60)
Glucose, Bld: 110 mg/dL — ABNORMAL HIGH (ref 65–99)
Potassium: 4.4 mmol/L (ref 3.5–5.1)
Sodium: 136 mmol/L (ref 135–145)
Total Bilirubin: 0.4 mg/dL (ref 0.3–1.2)
Total Protein: 7.7 g/dL (ref 6.5–8.1)

## 2017-09-02 LAB — URINALYSIS, ROUTINE W REFLEX MICROSCOPIC
Bilirubin Urine: NEGATIVE
Glucose, UA: NEGATIVE mg/dL
Hgb urine dipstick: NEGATIVE
Ketones, ur: NEGATIVE mg/dL
Leukocytes, UA: NEGATIVE
Nitrite: NEGATIVE
Protein, ur: NEGATIVE mg/dL
Specific Gravity, Urine: 1.025 (ref 1.005–1.030)
pH: 6.5 (ref 5.0–8.0)

## 2017-09-02 LAB — LIPASE, BLOOD: Lipase: 24 U/L (ref 11–51)

## 2017-09-02 MED ORDER — ACETAMINOPHEN 500 MG PO TABS
1000.0000 mg | ORAL_TABLET | Freq: Once | ORAL | Status: AC
  Administered 2017-09-02: 1000 mg via ORAL
  Filled 2017-09-02: qty 2

## 2017-09-02 MED ORDER — HYDROMORPHONE HCL 1 MG/ML IJ SOLN
0.5000 mg | INTRAMUSCULAR | Status: DC | PRN
  Administered 2017-09-02: 0.5 mg via INTRAVENOUS
  Filled 2017-09-02 (×2): qty 1

## 2017-09-02 MED ORDER — ONDANSETRON HCL 4 MG/2ML IJ SOLN: 4.0000 mg | Freq: Four times a day (QID) | INTRAMUSCULAR | Status: DC | PRN

## 2017-09-02 MED ORDER — METRONIDAZOLE IN NACL 5-0.79 MG/ML-% IV SOLN
500.0000 mg | Freq: Three times a day (TID) | INTRAVENOUS | Status: DC
  Administered 2017-09-03 – 2017-09-04 (×4): 500 mg via INTRAVENOUS
  Filled 2017-09-02 (×5): qty 100

## 2017-09-02 MED ORDER — ONDANSETRON 4 MG PO TBDP: 4.0000 mg | ORAL_TABLET | Freq: Four times a day (QID) | ORAL | Status: DC | PRN

## 2017-09-02 MED ORDER — HYDRALAZINE HCL 20 MG/ML IJ SOLN: 10.0000 mg | INTRAMUSCULAR | Status: DC | PRN

## 2017-09-02 MED ORDER — DEXTROSE 5 % IV SOLN
2.0000 g | INTRAVENOUS | Status: DC
  Administered 2017-09-03: 2 g via INTRAVENOUS
  Filled 2017-09-02 (×2): qty 2

## 2017-09-02 MED ORDER — IOPAMIDOL (ISOVUE-300) INJECTION 61%
100.0000 mL | Freq: Once | INTRAVENOUS | Status: AC | PRN
  Administered 2017-09-02: 100 mL via INTRAVENOUS

## 2017-09-02 MED ORDER — ENOXAPARIN SODIUM 40 MG/0.4ML ~~LOC~~ SOLN
40.0000 mg | SUBCUTANEOUS | Status: DC
  Administered 2017-09-02: 40 mg via SUBCUTANEOUS
  Filled 2017-09-02: qty 0.4

## 2017-09-02 MED ORDER — DEXTROSE 5 % IV SOLN
2.0000 g | Freq: Once | INTRAVENOUS | Status: AC
  Administered 2017-09-02: 2 g via INTRAVENOUS
  Filled 2017-09-02: qty 2

## 2017-09-02 MED ORDER — DIPHENHYDRAMINE HCL 50 MG/ML IJ SOLN: 12.5000 mg | Freq: Four times a day (QID) | INTRAMUSCULAR | Status: DC | PRN

## 2017-09-02 MED ORDER — HYDROMORPHONE HCL 1 MG/ML IJ SOLN
0.5000 mg | Freq: Once | INTRAMUSCULAR | Status: AC
  Administered 2017-09-02: 0.5 mg via INTRAVENOUS

## 2017-09-02 MED ORDER — METRONIDAZOLE IN NACL 5-0.79 MG/ML-% IV SOLN
500.0000 mg | Freq: Once | INTRAVENOUS | Status: AC
  Administered 2017-09-02: 500 mg via INTRAVENOUS
  Filled 2017-09-02: qty 100

## 2017-09-02 MED ORDER — SIMETHICONE 80 MG PO CHEW: 40.0000 mg | CHEWABLE_TABLET | Freq: Four times a day (QID) | ORAL | Status: DC | PRN

## 2017-09-02 MED ORDER — AMLODIPINE BESYLATE 5 MG PO TABS
5.0000 mg | ORAL_TABLET | Freq: Every day | ORAL | Status: DC
  Administered 2017-09-02 – 2017-09-04 (×2): 5 mg via ORAL
  Filled 2017-09-02 (×3): qty 1

## 2017-09-02 MED ORDER — KETOROLAC TROMETHAMINE 30 MG/ML IJ SOLN: 30.0000 mg | Freq: Four times a day (QID) | INTRAMUSCULAR | Status: DC | PRN

## 2017-09-02 MED ORDER — ONDANSETRON HCL 4 MG/2ML IJ SOLN
4.0000 mg | Freq: Once | INTRAMUSCULAR | Status: AC
  Administered 2017-09-02: 4 mg via INTRAVENOUS
  Filled 2017-09-02: qty 2

## 2017-09-02 MED ORDER — ACETAMINOPHEN 500 MG PO TABS
ORAL_TABLET | ORAL | Status: AC
  Filled 2017-09-02: qty 1

## 2017-09-02 MED ORDER — ACETAMINOPHEN 500 MG PO TABS
1000.0000 mg | ORAL_TABLET | Freq: Four times a day (QID) | ORAL | Status: DC
  Administered 2017-09-02 – 2017-09-03 (×2): 1000 mg via ORAL
  Filled 2017-09-02 (×3): qty 2

## 2017-09-02 MED ORDER — MORPHINE SULFATE (PF) 4 MG/ML IV SOLN: 4.0000 mg | Freq: Once | INTRAVENOUS | Status: DC

## 2017-09-02 MED ORDER — DIPHENHYDRAMINE HCL 12.5 MG/5ML PO ELIX: 12.5000 mg | ORAL_SOLUTION | Freq: Four times a day (QID) | ORAL | Status: DC | PRN

## 2017-09-02 MED ORDER — KETOROLAC TROMETHAMINE 30 MG/ML IJ SOLN
30.0000 mg | Freq: Four times a day (QID) | INTRAMUSCULAR | Status: DC
  Administered 2017-09-03 (×3): 30 mg via INTRAVENOUS
  Filled 2017-09-02 (×3): qty 1

## 2017-09-02 MED ORDER — HYDROMORPHONE HCL 1 MG/ML IJ SOLN: 1.0000 mg | INTRAMUSCULAR | Status: DC | PRN

## 2017-09-02 MED ORDER — ONDANSETRON HCL 4 MG/2ML IJ SOLN
4.0000 mg | Freq: Three times a day (TID) | INTRAMUSCULAR | Status: DC | PRN
  Administered 2017-09-02: 4 mg via INTRAVENOUS
  Filled 2017-09-02: qty 2

## 2017-09-02 MED ORDER — IRBESARTAN 300 MG PO TABS
300.0000 mg | ORAL_TABLET | Freq: Every day | ORAL | Status: DC
  Administered 2017-09-02 – 2017-09-04 (×2): 300 mg via ORAL
  Filled 2017-09-02 (×3): qty 1

## 2017-09-02 MED ORDER — DEXTROSE-NACL 5-0.9 % IV SOLN
INTRAVENOUS | Status: DC
  Administered 2017-09-02 – 2017-09-04 (×4): via INTRAVENOUS

## 2017-09-02 NOTE — ED Triage Notes (Signed)
C/o abd pain, n/v started this am-NAD-steady gait

## 2017-09-02 NOTE — ED Notes (Addendum)
1 tab Tylenol fell on the floor.  Removed another tab from the Pyxis to equal to 1000 mg as ordered.  Patient stated that at this time, his abdomen doesn't hurt but if it hurts, he scored it as a 10/10, sharp stabbing that is almost like a cramps but its not.

## 2017-09-02 NOTE — Telephone Encounter (Signed)
Wendling Pt headed to ED. Just an FYI.

## 2017-09-02 NOTE — Telephone Encounter (Signed)
noted 

## 2017-09-02 NOTE — ED Provider Notes (Signed)
[facility] EMERGENCY DEPARTMENT Provider Note   CSN: [id number] Arrival date & time: 09/02/17  1142     History   Chief Complaint Chief Complaint  Patient presents with  . Abdominal Pain    HPI Roger Salinas is a 27 y.o. male  Abdominal Pain: Patient complains of abdominal pain. The pain is described as colicky, and is 7/10 in intensity. Pain is located in the RLQ without radiation. Onset was today. Symptoms have been unchanged since. Aggravating factors: none.  Alleviating factors:none. Associated symptoms:emesis 10  times, beginning a few hours ago and loss of appetite. The patient denies diarrhea, sore throat and headache. Denies flank pain, urinary sx or hx of kidney stone.   HPI  Past Medical History:  Diagnosis Date  . Essential hypertension 07/03/2017  . Obstructive sleep apnea 06/03/2017    Patient Active Problem List   Diagnosis Date Noted  . Appendicitis 09/02/2017  . Plantar fasciitis 08/26/2017  . Essential hypertension 07/03/2017  . Obstructive sleep apnea 06/03/2017  . Near syncope 09/01/2016  . Bigeminal rhythm 09/01/2016  . Morbid obesity (HCC) 09/01/2016    Past Surgical History:  Procedure Laterality Date  . TONSILLECTOMY         Home Medications    Prior to Admission medications   Medication Sig Start Date End Date Taking? Authorizing Provider  amLODipine (NORVASC) 5 MG tablet Take 1 tablet (5 mg total) by mouth daily. 08/28/17   Sharlene DoryWendling, Nicholas Paul, DO  Diclofenac Sodium (PENNSAID) 2 % SOLN Apply thin layer over feet 4 times daily as needed. 08/26/17   Sharlene DoryWendling, Nicholas Paul, DO  valsartan (DIOVAN) 320 MG tablet Take 1 tablet (320 mg total) by mouth daily. 07/17/17   Sharlene DoryWendling, Nicholas Paul, DO    Family History Family History  Problem Relation Age of Onset  . Hypertension Mother   . Diabetes Mother   . Diabetes Maternal Uncle   . Diabetes Maternal Grandmother   . Diabetes Maternal Uncle   . Heart attack Neg Hx   .  Hyperlipidemia Neg Hx   . Sudden death Neg Hx     Social History Social History   Tobacco Use  . Smoking status: Former Smoker    Packs/day: 1.00    Years: 12.00    Pack years: 12.00    Types: Cigarettes    Start date: 2006    Last attempt to quit: 04/15/2017    Years since quitting: 0.3  . Smokeless tobacco: Never Used  Substance Use Topics  . Alcohol use: No    Frequency: Never  . Drug use: No     Allergies   Patient has no known allergies.   Review of Systems Review of Systems  Ten systems reviewed and are negative for acute change, except as noted in the HPI.   Physical Exam Updated Vital Signs BP (!) 154/92   Pulse 71 Comment: Simultaneous filing. User may not have seen previous data.  Temp 97.8 F (36.6 C) (Oral)   Resp 18   Ht 5\' 10"  (1.778 m)   Wt (!) 180.8 kg (398 lb 9.5 oz)   SpO2 99% Comment: Simultaneous filing. User may not have seen previous data.  BMI 57.19 kg/m   Physical Exam  Physical Exam  Nursing note and vitals reviewed. Constitutional: He appears well-developed and well-nourished. No distress.  HENT:  Head: Normocephalic and atraumatic.  Eyes: Conjunctivae normal are normal. No scleral icterus.  Neck: Normal range of motion. Neck supple.  Cardiovascular: Normal rate,  regular rhythm and normal heart sounds.   Pulmonary/Chest: Effort normal and breath sounds normal. No respiratory distress.  Abdominal: Soft. TTP RLQ and laterally to the flank. No CVA tenderness. Musculoskeletal: He exhibits no edema.  Neurological: He is alert.  Skin: Skin is warm and dry. He is not diaphoretic.  Psychiatric: His behavior is normal.    ED Treatments / Results  Labs (all labs ordered are listed, but only abnormal results are displayed) Labs Reviewed  CBC WITH DIFFERENTIAL/PLATELET - Abnormal; Notable for the following components:      Result Value   HCT 38.3 (*)    All other components within normal limits  COMPREHENSIVE METABOLIC PANEL -  Abnormal; Notable for the following components:   Glucose, Bld 110 (*)    All other components within normal limits  URINALYSIS, ROUTINE W REFLEX MICROSCOPIC  LIPASE, BLOOD    EKG  EKG Interpretation None       Radiology Ct Abdomen Pelvis W Contrast  Result Date: 09/02/2017 CLINICAL DATA:  Right lower quadrant abdomen pain for 1 day EXAM: CT ABDOMEN AND PELVIS WITH CONTRAST TECHNIQUE: Multidetector CT imaging of the abdomen and pelvis was performed using the standard protocol following bolus administration of intravenous contrast. CONTRAST:  ISOVUE-300 IOPAMIDOL (ISOVUE-300) INJECTION 61% COMPARISON:  None. Patient's prior CT scan is not available on PACs for comparison. FINDINGS: Lower chest: No acute abnormality. Hepatobiliary: No focal liver abnormality is seen. No gallstones, gallbladder wall thickening, or biliary dilatation. Pancreas: Unremarkable. No pancreatic ductal dilatation or surrounding inflammatory changes. Spleen: Normal in size without focal abnormality. Adrenals/Urinary Tract: Adrenal glands are unremarkable. Kidneys are normal, without renal calculi, focal lesion, or hydronephrosis. Bladder is unremarkable. Stomach/Bowel: The stomach is normal. There no small bowel obstruction. There is no diverticulitis. There is mild stranding surrounding normal caliber appendix. Vascular/Lymphatic: No significant vascular findings are present. No enlarged abdominal or pelvic lymph nodes. Reproductive: Prostate gland is normal. Other: No abdominal wall hernia or abnormality. No abdominopelvic ascites. Musculoskeletal: No acute or significant osseous findings. IMPRESSION: Mild stranding and inflammation surrounding normal caliber appendix. Early appendicitis is not excluded given patient's symptoms. Electronically Signed   By: Sherian Rein M.D.   On: 09/02/2017 14:23    Procedures Procedures (including critical care time)  Medications Ordered in ED Medications  morphine 4 MG/ML  injection 4 mg (4 mg Intravenous Not Given 09/02/17 1236)  HYDROmorphone (DILAUDID) injection 0.5 mg (0.5 mg Intravenous Given 09/02/17 1532)  ondansetron (ZOFRAN) injection 4 mg (4 mg Intravenous Given 09/02/17 1528)  cefTRIAXone (ROCEPHIN) 2 g in dextrose 5 % 50 mL IVPB (0 g Intravenous Stopped 09/02/17 1559)    And  metroNIDAZOLE (FLAGYL) IVPB 500 mg (500 mg Intravenous New Bag/Given 09/02/17 1604)  ondansetron (ZOFRAN) injection 4 mg (4 mg Intravenous Given 09/02/17 1242)  acetaminophen (TYLENOL) tablet 1,000 mg (1,000 mg Oral Given 09/02/17 1348)  iopamidol (ISOVUE-300) 61 % injection 100 mL (100 mLs Intravenous Contrast Given 09/02/17 1408)     Initial Impression / Assessment and Plan / ED Course  I have reviewed the triage vital signs and the nursing notes.  Pertinent labs & imaging results that were available during my care of the patient were reviewed by me and considered in my medical decision making (see chart for details).     Patient without elevated white blood cell count or fever however CT scan does show inflammatory stranding around the patient's appendix and he is point tender in his right lower quadrant.  I spoken with  Dr. Johna Sheriff who has accepted the patient for admission at [facility] emergency department.  Patient has been placed on Flagyl and Rocephin for intra-abdominal infection.  He is an otherwise low risk appendicitis.  Pain and nausea are improved here in the emergency department.  Final Clinical Impressions(s) / ED Diagnoses   Final diagnoses:  Acute appendicitis with peritonitis    ED Discharge Orders    None       Arthor Captain, PA-C 09/02/17 1605    Vanetta Mulders, MD 09/04/17 [phone]

## 2017-09-02 NOTE — Telephone Encounter (Signed)
   Reason for Disposition . [1] MILD or MODERATE vomiting AND [2] present > 48 hours (2 days) (Exception: mild vomiting with associated diarrhea)  Answer Assessment - Initial Assessment Questions 1. VOMITING SEVERITY: "How many times have you vomited in the past 24 hours?"     - MILD:  1 - 2 times/day    - MODERATE: 3 - 5 times/day, decreased oral intake without significant weight loss or symptoms of dehydration    - SEVERE: 6 or more times/day, vomits everything or nearly everything, with significant weight loss, symptoms of dehydration      3 2. ONSET: "When did the vomiting begin?"      0400 3. FLUIDS: "What fluids or food have you vomited up today?" "Have you been able to keep any fluids down?"     Just water 4. ABDOMINAL PAIN: "Are your having any abdominal pain?" If yes : "How bad is it and what does it feel like?" (e.g., crampy, dull, intermittent, constant)      Right lower quadrant pain 5. DIARRHEA: "Is there any diarrhea?" If so, ask: "How many times today?"      No 6. CONTACTS: "Is there anyone else in the family with the same symptoms?"      No 7. CAUSE: "What do you think is causing your vomiting?"     Unsure 8. HYDRATION STATUS: "Any signs of dehydration?" (e.g., dry mouth [not only dry lips], too weak to stand) "When did you last urinate?"     Voiding 9. OTHER SYMPTOMS: "Do you have any other symptoms?" (e.g., fever, headache, vertigo, vomiting blood or coffee grounds, recent head injury)     Felt warm 10. PREGNANCY: "Is there any chance you are pregnant?" "When was your last menstrual period?"       No  Protocols used: VOMITING-A-AH  Pain right lower abdomin - doubles pt. Over. Comes and goes. Scale 1-10 pain is a 10. Pt. States since there is no availability, he will go to ED.

## 2017-09-02 NOTE — H&P (Signed)
Roger Salinas is an 27 y.o. male.   Chief Complaint: ABDOMINAL PAIN HPI: Pt sent from [facility] ED with 12 hour hx of severe RLQ abdominal pain.  Pain came on suddenly this am at work and located in RLQ and right flank.  Sharp and crampy at times.  Denies pain currently.  Had multiple episodes of emesis this am but better.  Nothing makes it better or worse. CT shows equivocal signs of appendicitis.   Past Medical History:  Diagnosis Date  . Essential hypertension 07/03/2017  . Obstructive sleep apnea 06/03/2017    Past Surgical History:  Procedure Laterality Date  . TONSILLECTOMY      Family History  Problem Relation Age of Onset  . Hypertension Mother   . Diabetes Mother   . Diabetes Maternal Uncle   . Diabetes Maternal Grandmother   . Diabetes Maternal Uncle   . Heart attack Neg Hx   . Hyperlipidemia Neg Hx   . Sudden death Neg Hx    Social History:  reports that he quit smoking about 4 months ago. His smoking use included cigarettes. He started smoking about 13 years ago. He has a 12.00 pack-year smoking history. he has never used smokeless tobacco. He reports that he does not drink alcohol or use drugs.  Allergies: No Known Allergies  Medications Prior to Admission  Medication Sig Dispense Refill  . amLODipine (NORVASC) 5 MG tablet Take 1 tablet (5 mg total) by mouth daily. 30 tablet 3  . Diclofenac Sodium (PENNSAID) 2 % SOLN Apply thin layer over feet 4 times daily as needed. 1 Bottle 2  . valsartan (DIOVAN) 320 MG tablet Take 1 tablet (320 mg total) by mouth daily. 30 tablet 3    Results for orders placed or performed during the hospital encounter of 09/02/17 (from the past 48 hour(s))  Urinalysis, Routine w reflex microscopic     Status: None   Collection Time: 09/02/17 11:51 AM  Result Value Ref Range   Color, Urine YELLOW YELLOW   APPearance CLEAR CLEAR   Specific Gravity, Urine 1.025 1.005 - 1.030   pH 6.5 5.0 - 8.0   Glucose, UA NEGATIVE NEGATIVE mg/dL   Hgb urine  dipstick NEGATIVE NEGATIVE   Bilirubin Urine NEGATIVE NEGATIVE   Ketones, ur NEGATIVE NEGATIVE mg/dL   Protein, ur NEGATIVE NEGATIVE mg/dL   Nitrite NEGATIVE NEGATIVE   Leukocytes, UA NEGATIVE NEGATIVE    Comment: Microscopic not done on urines with negative protein, blood, leukocytes, nitrite, or glucose < 500 mg/dL.  CBC with Differential     Status: Abnormal   Collection Time: 09/02/17 12:31 PM  Result Value Ref Range   WBC 10.0 4.0 - 10.5 K/uL   RBC 4.78 4.22 - 5.81 MIL/uL   Hemoglobin 13.1 13.0 - 17.0 g/dL   HCT 38.3 (L) 39.0 - 52.0 %   MCV 80.1 78.0 - 100.0 fL   MCH 27.4 26.0 - 34.0 pg   MCHC 34.2 30.0 - 36.0 g/dL   RDW 13.5 11.5 - 15.5 %   Platelets 279 150 - 400 K/uL   Neutrophils Relative % 67 %   Neutro Abs 6.7 1.7 - 7.7 K/uL   Lymphocytes Relative 26 %   Lymphs Abs 2.6 0.7 - 4.0 K/uL   Monocytes Relative 5 %   Monocytes Absolute 0.5 0.1 - 1.0 K/uL   Eosinophils Relative 2 %   Eosinophils Absolute 0.2 0.0 - 0.7 K/uL   Basophils Relative 0 %   Basophils Absolute 0.0 0.0 -  0.1 K/uL  Comprehensive metabolic panel     Status: Abnormal   Collection Time: 09/02/17 12:31 PM  Result Value Ref Range   Sodium 136 135 - 145 mmol/L   Potassium 4.4 3.5 - 5.1 mmol/L   Chloride 101 101 - 111 mmol/L   CO2 29 22 - 32 mmol/L   Glucose, Bld 110 (H) 65 - 99 mg/dL   BUN 19 6 - 20 mg/dL   Creatinine, Ser 0.94 0.61 - 1.24 mg/dL   Calcium 9.5 8.9 - 10.3 mg/dL   Total Protein 7.7 6.5 - 8.1 g/dL   Albumin 4.0 3.5 - 5.0 g/dL   AST 21 15 - 41 U/L   ALT 35 17 - 63 U/L   Alkaline Phosphatase 107 38 - 126 U/L   Total Bilirubin 0.4 0.3 - 1.2 mg/dL   GFR calc non Af Amer >60 >60 mL/min   GFR calc Af Amer >60 >60 mL/min    Comment: (NOTE) The eGFR has been calculated using the CKD EPI equation. This calculation has not been validated in all clinical situations. eGFR's persistently <60 mL/min signify possible Chronic Kidney Disease.    Anion gap 6 5 - 15  Lipase, blood     Status: None    Collection Time: 09/02/17 12:31 PM  Result Value Ref Range   Lipase 24 11 - 51 U/L   Ct Abdomen Pelvis W Contrast  Result Date: 09/02/2017 CLINICAL DATA:  Right lower quadrant abdomen pain for 1 day EXAM: CT ABDOMEN AND PELVIS WITH CONTRAST TECHNIQUE: Multidetector CT imaging of the abdomen and pelvis was performed using the standard protocol following bolus administration of intravenous contrast. CONTRAST:  169m ISOVUE-300 IOPAMIDOL (ISOVUE-300) INJECTION 61% COMPARISON:  None. Patient's prior CT scan is not available on PACs for comparison. FINDINGS: Lower chest: No acute abnormality. Hepatobiliary: No focal liver abnormality is seen. No gallstones, gallbladder wall thickening, or biliary dilatation. Pancreas: Unremarkable. No pancreatic ductal dilatation or surrounding inflammatory changes. Spleen: Normal in size without focal abnormality. Adrenals/Urinary Tract: Adrenal glands are unremarkable. Kidneys are normal, without renal calculi, focal lesion, or hydronephrosis. Bladder is unremarkable. Stomach/Bowel: The stomach is normal. There no small bowel obstruction. There is no diverticulitis. There is mild stranding surrounding normal caliber appendix. Vascular/Lymphatic: No significant vascular findings are present. No enlarged abdominal or pelvic lymph nodes. Reproductive: Prostate gland is normal. Other: No abdominal wall hernia or abnormality. No abdominopelvic ascites. Musculoskeletal: No acute or significant osseous findings. IMPRESSION: Mild stranding and inflammation surrounding normal caliber appendix. Early appendicitis is not excluded given patient's symptoms. Electronically Signed   By: WAbelardo DieselM.D.   On: 09/02/2017 14:23    Review of Systems  Constitutional: Negative for chills and fever.  HENT: Negative for hearing loss and tinnitus.   Eyes: Negative for blurred vision and double vision.  Respiratory: Negative for cough, hemoptysis and shortness of breath.   Cardiovascular:  Negative for chest pain and palpitations.  Gastrointestinal: Positive for abdominal pain, nausea and vomiting. Negative for blood in stool, constipation, diarrhea and heartburn.  Genitourinary: Negative for dysuria, flank pain, hematuria and urgency.  Musculoskeletal: Negative for myalgias and neck pain.  Skin: Negative for rash.  Neurological: Negative for dizziness.    Blood pressure 138/84, pulse 81, temperature 97.9 F (36.6 C), temperature source Oral, resp. rate 18, height '5\' 10"'  (1.778 m), weight (!) 180.8 kg (398 lb 9.5 oz), SpO2 95 %. Physical Exam  Constitutional: He appears well-developed and well-nourished.  HENT:  Head: Normocephalic and atraumatic.  Eyes: Pupils are equal, round, and reactive to light.  Neck: Normal range of motion. Neck supple.  Cardiovascular: Normal rate and regular rhythm.  Respiratory: Effort normal and breath sounds normal.  GI: There is tenderness in the right lower quadrant. There is no rigidity, no rebound, no guarding, no CVA tenderness and no tenderness at McBurney's point.  Musculoskeletal: Normal range of motion.  Neurological: He is alert.  Skin: Skin is warm and dry.  Psychiatric: He has a normal mood and affect. His behavior is normal. Judgment and thought content normal.     Assessment/Plan Abdominal pain  CT reviewed and not clear he has appendicitis Symptoms are early though.  He is very obese making exam difficult to interpret.  He does have mild RLQ tenderness.  Will admit for observation and start ABX since abx unreliable.  If he has pain in AM recommend laparoscopy.  He is comfortable currently.  Discussed plan with patient and family and they agree.  Home CPAP TO BE BROUGHT IN BY FAMILY.   Turner Daniels, MD 09/02/2017, 7:47 PM

## 2017-09-03 DIAGNOSIS — R1031 Right lower quadrant pain: Secondary | ICD-10-CM | POA: Diagnosis not present

## 2017-09-03 LAB — CBC
HCT: 39.8 % (ref 39.0–52.0)
Hemoglobin: 13.2 g/dL (ref 13.0–17.0)
MCH: 27 pg (ref 26.0–34.0)
MCHC: 33.2 g/dL (ref 30.0–36.0)
MCV: 81.4 fL (ref 78.0–100.0)
Platelets: 272 10*3/uL (ref 150–400)
RBC: 4.89 MIL/uL (ref 4.22–5.81)
RDW: 13.4 % (ref 11.5–15.5)
WBC: 6.7 10*3/uL (ref 4.0–10.5)

## 2017-09-03 LAB — COMPREHENSIVE METABOLIC PANEL
ALT: 52 U/L (ref 17–63)
AST: 31 U/L (ref 15–41)
Albumin: 3.5 g/dL (ref 3.5–5.0)
Alkaline Phosphatase: 116 U/L (ref 38–126)
Anion gap: 4 — ABNORMAL LOW (ref 5–15)
BUN: 11 mg/dL (ref 6–20)
CO2: 31 mmol/L (ref 22–32)
Calcium: 8.7 mg/dL — ABNORMAL LOW (ref 8.9–10.3)
Chloride: 103 mmol/L (ref 101–111)
Creatinine, Ser: 0.88 mg/dL (ref 0.61–1.24)
GFR calc Af Amer: >60 mL/min (ref >60)
GFR calc non Af Amer: >60 mL/min (ref >60)
Glucose, Bld: 136 mg/dL — ABNORMAL HIGH (ref 65–99)
Potassium: 3.8 mmol/L (ref 3.5–5.1)
Sodium: 138 mmol/L (ref 135–145)
Total Bilirubin: 0.4 mg/dL (ref 0.3–1.2)
Total Protein: 7 g/dL (ref 6.5–8.1)

## 2017-09-03 LAB — HIV ANTIBODY (ROUTINE TESTING W REFLEX): HIV Screen 4th Generation wRfx: NONREACTIVE

## 2017-09-03 LAB — SURGICAL PCR SCREEN
MRSA, PCR: NEGATIVE
Staphylococcus aureus: NEGATIVE

## 2017-09-03 MED ORDER — CHLORHEXIDINE GLUCONATE CLOTH 2 % EX PADS
6.0000 | MEDICATED_PAD | Freq: Every day | CUTANEOUS | Status: DC
  Administered 2017-09-03: 6 via TOPICAL

## 2017-09-03 NOTE — Progress Notes (Signed)
[facility] Progress Note     Subjective: CC-  Lying in bed comfortably. States that his pain is currently 0/10. No n/v this morning. States that he had some RLQ earlier this morning, rated 4/10, but it is improved from yesterday.  Objective: Vital signs in last 24 hours: Temp:  [97.8 F (36.6 C)-98.1 F (36.7 C)] 98.1 F (36.7 C) (01/22 0525) Pulse Rate:  [66-81] 66 (01/22 0934) Resp:  [16-18] 16 (01/22 0934) BP: (105-173)/(69-103) 128/69 (01/22 0934) SpO2:  [95 %-100 %] 97 % (01/22 0934) Weight:  [398 lb 9.5 oz (180.8 kg)] 398 lb 9.5 oz (180.8 kg) (01/21 1146) Last BM Date: 09/01/17  Intake/Output from previous day: 01/21 0701 - 01/22 0700 In: 951.7 [P.O.:200; I.V.:451.7; IV Piggyback:300] Out: -  Intake/Output this shift: No intake/output data recorded.  PE: Gen:  Alert, NAD HEENT: EOM's intact, pupils equal and round Card:  RRR, no M/G/R heard Pulm:  CTAB, no W/R/R, effort normal Abd: obese, soft, +BS, no HSM, no hernia, trace RLQ TTP with deep palpation, no rebound or guarding Ext:  No erythema, edema, or tenderness BUE/BLE  Psych: A&Ox3  Skin: no rashes noted, warm and dry  Lab Results:  Recent Labs    09/02/17 1231 09/03/17 0528  WBC 10.0 6.7  HGB 13.1 13.2  HCT 38.3* 39.8  PLT 279 272   BMET Recent Labs    09/02/17 1231 09/03/17 0528  NA 136 138  K 4.4 3.8  CL 101 103  CO2 29 31  GLUCOSE 110* 136*  BUN 19 11  CREATININE 0.94 0.88  CALCIUM 9.5 8.7*   PT/INR No results for input(s): LABPROT, INR in the last 72 hours. CMP     Component Value Date/Time   NA 138 09/03/2017 0528   K 3.8 09/03/2017 0528   CL 103 09/03/2017 0528   CO2 31 09/03/2017 0528   GLUCOSE 136 (H) 09/03/2017 0528   BUN 11 09/03/2017 0528   CREATININE 0.88 09/03/2017 0528   CALCIUM 8.7 (L) 09/03/2017 0528   PROT 7.0 09/03/2017 0528   ALBUMIN 3.5 09/03/2017 0528   AST 31 09/03/2017 0528   ALT 52 09/03/2017 0528   ALKPHOS 116 09/03/2017 0528   BILITOT  0.4 09/03/2017 0528   GFRNONAA >60 09/03/2017 0528   GFRAA >60 09/03/2017 0528   Lipase     Component Value Date/Time   LIPASE 24 09/02/2017 1231       Studies/Results: Ct Abdomen Pelvis W Contrast  Result Date: 09/02/2017 CLINICAL DATA:  Right lower quadrant abdomen pain for 1 day EXAM: CT ABDOMEN AND PELVIS WITH CONTRAST TECHNIQUE: Multidetector CT imaging of the abdomen and pelvis was performed using the standard protocol following bolus administration of intravenous contrast. CONTRAST:  100mL ISOVUE-300 IOPAMIDOL (ISOVUE-300) INJECTION 61% COMPARISON:  None. Patient's prior CT scan is not available on PACs for comparison. FINDINGS: Lower chest: No acute abnormality. Hepatobiliary: No focal liver abnormality is seen. No gallstones, gallbladder wall thickening, or biliary dilatation. Pancreas: Unremarkable. No pancreatic ductal dilatation or surrounding inflammatory changes. Spleen: Normal in size without focal abnormality. Adrenals/Urinary Tract: Adrenal glands are unremarkable. Kidneys are normal, without renal calculi, focal lesion, or hydronephrosis. Bladder is unremarkable. Stomach/Bowel: The stomach is normal. There no small bowel obstruction. There is no diverticulitis. There is mild stranding surrounding normal caliber appendix. Vascular/Lymphatic: No significant vascular findings are present. No enlarged abdominal or pelvic lymph nodes. Reproductive: Prostate gland is normal. Other: No abdominal wall hernia or abnormality. No abdominopelvic ascites. Musculoskeletal: No  acute or significant osseous findings. IMPRESSION: Mild stranding and inflammation surrounding normal caliber appendix. Early appendicitis is not excluded given patient's symptoms. Electronically Signed   By: Sherian Rein M.D.   On: 09/02/2017 14:23    Anti-infectives: Anti-infectives (From admission, onward)   Start     Dose/Rate Route Frequency Ordered Stop   09/03/17 1500  cefTRIAXone (ROCEPHIN) 2 g in dextrose 5 %  50 mL IVPB     2 g 100 mL/hr over 30 Minutes Intravenous Every 24 hours 09/02/17 2029     09/02/17 2359  metroNIDAZOLE (FLAGYL) IVPB 500 mg     500 mg 100 mL/hr over 60 Minutes Intravenous Every 8 hours 09/02/17 2029     09/02/17 1515  cefTRIAXone (ROCEPHIN) 2 g in dextrose 5 % 50 mL IVPB     2 g 100 mL/hr over 30 Minutes Intravenous  Once 09/02/17 1511 09/02/17 1559   09/02/17 1515  metroNIDAZOLE (FLAGYL) IVPB 500 mg     500 mg 100 mL/hr over 60 Minutes Intravenous  Once 09/02/17 1511 09/02/17 1813       Assessment/Plan OSA - home CPAP HTN - home meds   RLQ abdominal pain - CT scan showed equivocal signs of appendicitis, mild stranding and inflammation surrounding normal caliber appendix - WBC 6.7, VSS  ID - rocephin/flagyl 1/21>> FEN - IVF, NPO VTE - SCDs, lovenox Foley - none Follow up - TBD  Plan - Patient reports improving abdominal pain, currently 0/10. His WBC is WNL and VSS. He does not appear to need urgent surgical intervention. Continue NPO and IV antibiotics.    LOS: 0 days    Franne Forts , [facility] 09/03/2017, 10:21 AM Pager: [phone] Consults: [phone] Mon-Fri 7:00 am-4:30 pm Sat-Sun 7:00 am-11:30 am

## 2017-09-04 ENCOUNTER — Ambulatory Visit (INDEPENDENT_AMBULATORY_CARE_PROVIDER_SITE_OTHER): Payer: BLUE CROSS/BLUE SHIELD | Admitting: Internal Medicine

## 2017-09-04 ENCOUNTER — Encounter: Payer: Self-pay | Admitting: Internal Medicine

## 2017-09-04 ENCOUNTER — Ambulatory Visit: Payer: BLUE CROSS/BLUE SHIELD

## 2017-09-04 VITALS — BP 140/84 | HR 80 | Ht 70.0 in | Wt >= 6400 oz

## 2017-09-04 DIAGNOSIS — R1031 Right lower quadrant pain: Secondary | ICD-10-CM | POA: Diagnosis not present

## 2017-09-04 DIAGNOSIS — G4733 Obstructive sleep apnea (adult) (pediatric): Secondary | ICD-10-CM

## 2017-09-04 LAB — CBC
HCT: 38.5 % — ABNORMAL LOW (ref 39.0–52.0)
Hemoglobin: 12.7 g/dL — ABNORMAL LOW (ref 13.0–17.0)
MCH: 26.8 pg (ref 26.0–34.0)
MCHC: 33 g/dL (ref 30.0–36.0)
MCV: 81.2 fL (ref 78.0–100.0)
Platelets: 243 10*3/uL (ref 150–400)
RBC: 4.74 MIL/uL (ref 4.22–5.81)
RDW: 13.2 % (ref 11.5–15.5)
WBC: 8 10*3/uL (ref 4.0–10.5)

## 2017-09-04 MED ORDER — AMOXICILLIN-POT CLAVULANATE 875-125 MG PO TABS
1.0000 | ORAL_TABLET | Freq: Two times a day (BID) | ORAL | Status: DC
  Administered 2017-09-04: 1 via ORAL
  Filled 2017-09-04: qty 1

## 2017-09-04 MED ORDER — AMOXICILLIN-POT CLAVULANATE 875-125 MG PO TABS: 1.0000 | ORAL_TABLET | Freq: Two times a day (BID) | ORAL | 0 refills | Status: DC

## 2017-09-04 NOTE — Discharge Instructions (Signed)
Take antibiotics as prescribed Follow up with Dr. Johna SheriffHoxworth in 1-2 weeks Call with concerns (ie increased abdominal pain, nausea, vomiting, fever...)

## 2017-09-04 NOTE — Progress Notes (Signed)
[facility] Progress Note     Subjective: CC-  Patient states that he is feeling great. He has not had any abdominal pain since yesterday morning. Tolerating clear liquids. Denies n/v.   Objective: Vital signs in last 24 hours: Temp:  [97.8 F (36.6 C)-98.3 F (36.8 C)] 97.8 F (36.6 C) (01/23 0539) Pulse Rate:  [65-71] 68 (01/23 0539) Resp:  [16-18] 18 (01/23 0539) BP: (128-153)/(65-111) 153/111 (01/23 0539) SpO2:  [96 %-100 %] 98 % (01/23 0539) Last BM Date: 09/01/17  Intake/Output from previous day: 01/22 0701 - 01/23 0700 In: 2810 [P.O.:60; I.V.:2400; IV Piggyback:350] Out: -  Intake/Output this shift: No intake/output data recorded.  PE: Gen:  Alert, NAD, pleasant HEENT: EOM's intact, pupils equal and round Card:  RRR, no M/G/R heard Pulm:  CTAB, no W/R/R, effort normal Abd: obese, soft, NT/ND, +BS, no HSM, no hernia Ext:  No erythema, edema, or tenderness BUE/BLE  Psych: A&Ox3  Skin: no rashes noted, warm and dry  Lab Results:  Recent Labs    09/03/17 0528 09/04/17 0443  WBC 6.7 8.0  HGB 13.2 12.7*  HCT 39.8 38.5*  PLT 272 243   BMET Recent Labs    09/02/17 1231 09/03/17 0528  NA 136 138  K 4.4 3.8  CL 101 103  CO2 29 31  GLUCOSE 110* 136*  BUN 19 11  CREATININE 0.94 0.88  CALCIUM 9.5 8.7*   PT/INR No results for input(s): LABPROT, INR in the last 72 hours. CMP     Component Value Date/Time   NA 138 09/03/2017 0528   K 3.8 09/03/2017 0528   CL 103 09/03/2017 0528   CO2 31 09/03/2017 0528   GLUCOSE 136 (H) 09/03/2017 0528   BUN 11 09/03/2017 0528   CREATININE 0.88 09/03/2017 0528   CALCIUM 8.7 (L) 09/03/2017 0528   PROT 7.0 09/03/2017 0528   ALBUMIN 3.5 09/03/2017 0528   AST 31 09/03/2017 0528   ALT 52 09/03/2017 0528   ALKPHOS 116 09/03/2017 0528   BILITOT 0.4 09/03/2017 0528   GFRNONAA >60 09/03/2017 0528   GFRAA >60 09/03/2017 0528   Lipase     Component Value Date/Time   LIPASE 24 09/02/2017 1231        Studies/Results: Ct Abdomen Pelvis W Contrast  Result Date: 09/02/2017 CLINICAL DATA:  Right lower quadrant abdomen pain for 1 day EXAM: CT ABDOMEN AND PELVIS WITH CONTRAST TECHNIQUE: Multidetector CT imaging of the abdomen and pelvis was performed using the standard protocol following bolus administration of intravenous contrast. CONTRAST:  ISOVUE-300 IOPAMIDOL (ISOVUE-300) INJECTION 61% COMPARISON:  None. Patient's prior CT scan is not available on PACs for comparison. FINDINGS: Lower chest: No acute abnormality. Hepatobiliary: No focal liver abnormality is seen. No gallstones, gallbladder wall thickening, or biliary dilatation. Pancreas: Unremarkable. No pancreatic ductal dilatation or surrounding inflammatory changes. Spleen: Normal in size without focal abnormality. Adrenals/Urinary Tract: Adrenal glands are unremarkable. Kidneys are normal, without renal calculi, focal lesion, or hydronephrosis. Bladder is unremarkable. Stomach/Bowel: The stomach is normal. There no small bowel obstruction. There is no diverticulitis. There is mild stranding surrounding normal caliber appendix. Vascular/Lymphatic: No significant vascular findings are present. No enlarged abdominal or pelvic lymph nodes. Reproductive: Prostate gland is normal. Other: No abdominal wall hernia or abnormality. No abdominopelvic ascites. Musculoskeletal: No acute or significant osseous findings. IMPRESSION: Mild stranding and inflammation surrounding normal caliber appendix. Early appendicitis is not excluded given patient's symptoms. Electronically Signed   By: Gabriel Carina.D.  On: 09/02/2017 14:23    Anti-infectives: Anti-infectives (From admission, onward)   Start     Dose/Rate Route Frequency Ordered Stop   09/03/17 1500  cefTRIAXone (ROCEPHIN) 2 g in dextrose 5 % 50 mL IVPB  Status:  Discontinued     2 g 100 mL/hr over 30 Minutes Intravenous Every 24 hours 09/02/17 2029 09/04/17 0842   09/02/17 2359   metroNIDAZOLE (FLAGYL) IVPB 500 mg  Status:  Discontinued     500 mg 100 mL/hr over 60 Minutes Intravenous Every 8 hours 09/02/17 2029 09/04/17 0842   09/02/17 1515  cefTRIAXone (ROCEPHIN) 2 g in dextrose 5 % 50 mL IVPB     2 g 100 mL/hr over 30 Minutes Intravenous  Once 09/02/17 1511 09/02/17 1559   09/02/17 1515  metroNIDAZOLE (FLAGYL) IVPB 500 mg     500 mg 100 mL/hr over 60 Minutes Intravenous  Once 09/02/17 1511 09/02/17 1813       Assessment/Plan OSA - home CPAP HTN - home meds   RLQ abdominal pain - CT scan showed equivocal signs of appendicitis, mild stranding and inflammation surrounding normal caliber appendix - WBC 8.0, VSS  ID - augmentin 1/23>>, rocephin/flagyl 1/21>>1/23 FEN - KVO IVF, regular diet VTE - SCDs, lovenox Foley - none Follow up - 1-2 weeks with Dr. Johna SheriffHoxworth  Plan - Abdominal pain resolved, VSS, WBC WNL. Will advance to regular diet. If patient tolerates breakfast he may be discharged. He will go home with 1 week of augmentin and follow up with Dr. Johna SheriffHoxworth.   LOS: 0 days    Franne FortsBrooke A Earlee Herald , [facility] 09/04/2017, 8:42 AM Pager: [phone] Consults: [phone] Mon-Fri 7:00 am-4:30 pm Sat-Sun 7:00 am-11:30 am

## 2017-09-04 NOTE — Patient Instructions (Signed)
OrderAutomotive engineer- [facility]  Please reduce autoPAP range to 5-15, continue mask of choice, humidifier, supplies, AirView.      He may need mask refit for comfort and leaks.  Please call if we can help.

## 2017-09-04 NOTE — Progress Notes (Signed)
Pt alert and oriented.  Tolerated breakfast with no complaints of pain or nausea.  D/C instructions and prescriptions were given, all questions answered.

## 2017-09-04 NOTE — Discharge Summary (Signed)
[facility] Discharge Summary   Patient ID: Roger Salinas MRN: [id number] DOB/AGE: Oct 18, 1990 27 y.o.  Admit date: 09/02/2017 Discharge date: 09/04/2017  Admitting Diagnosis: RLQ abdominal pain  Discharge Diagnosis Patient Active Problem List   Diagnosis Date Noted  . Appendicitis 09/02/2017  . Abdominal pain 09/02/2017  . Plantar fasciitis 08/26/2017  . Essential hypertension 07/03/2017  . Obstructive sleep apnea 06/03/2017  . Near syncope 09/01/2016  . Bigeminal rhythm 09/01/2016  . Morbid obesity (HCC) 09/01/2016    Consultants None  Imaging: Ct Abdomen Pelvis W Contrast  Result Date: 09/02/2017 CLINICAL DATA:  Right lower quadrant abdomen pain for 1 day EXAM: CT ABDOMEN AND PELVIS WITH CONTRAST TECHNIQUE: Multidetector CT imaging of the abdomen and pelvis was performed using the standard protocol following bolus administration of intravenous contrast. CONTRAST:  ISOVUE-300 IOPAMIDOL (ISOVUE-300) INJECTION 61% COMPARISON:  None. Patient's prior CT scan is not available on PACs for comparison. FINDINGS: Lower chest: No acute abnormality. Hepatobiliary: No focal liver abnormality is seen. No gallstones, gallbladder wall thickening, or biliary dilatation. Pancreas: Unremarkable. No pancreatic ductal dilatation or surrounding inflammatory changes. Spleen: Normal in size without focal abnormality. Adrenals/Urinary Tract: Adrenal glands are unremarkable. Kidneys are normal, without renal calculi, focal lesion, or hydronephrosis. Bladder is unremarkable. Stomach/Bowel: The stomach is normal. There no small bowel obstruction. There is no diverticulitis. There is mild stranding surrounding normal caliber appendix. Vascular/Lymphatic: No significant vascular findings are present. No enlarged abdominal or pelvic lymph nodes. Reproductive: Prostate gland is normal. Other: No abdominal wall hernia or abnormality. No abdominopelvic ascites. Musculoskeletal: No acute or  significant osseous findings. IMPRESSION: Mild stranding and inflammation surrounding normal caliber appendix. Early appendicitis is not excluded given patient's symptoms. Electronically Signed   By: Sherian Rein M.D.   On: 09/02/2017 14:23    Procedures None  Hospital Course:  Roger Salinas is a 27yo male PMH obesity who presented to [facility] 1/21 with 12 hours of RLQ pain.  Workup included CT scan which showed equivocal signs of appendicitis.  He is very obese making exam difficult to interpret. The decision was made to wait on laparoscopy and the patient was admitted for observation and started on antibiotics. Pain resolved therefore he was treated with antibiotics. Diet was advanced as tolerated.  On 1/23 the patient was voiding well, tolerating diet, ambulating well, pain well controlled, vital signs stable and felt stable for discharge home with 1 week of augmentin.  Patient will follow up in our office in 1-2 weeks and knows to call with questions or concerns.       Allergies as of 09/04/2017   No Known Allergies     Medication List    TAKE these medications   amLODipine 5 MG tablet Commonly known as:  NORVASC Take 1 tablet (5 mg total) by mouth daily.   amoxicillin-clavulanate 875-125 MG tablet Commonly known as:  AUGMENTIN Take 1 tablet by mouth 2 (two) times daily.   cholecalciferol 1000 units tablet Commonly known as:  VITAMIN D Take 1,000 Units by mouth 2 (two) times daily.   Diclofenac Sodium 2 % Soln Commonly known as:  PENNSAID Apply thin layer over feet 4 times daily as needed. What changed:  additional instructions   valsartan 320 MG tablet Commonly known as:  DIOVAN Take 1 tablet (320 mg total) by mouth daily.        Follow-up Information    Glenna Fellows, MD. Call in 1 week(s).   Specialty:  General Surgery Why:  Call as  soon as you leave the hospital to schedule a follow up appointment Contact information: [address] [place] [address] [phone]           Signed: Franne FortsBrooke A Emin Foree, [facility] 09/04/2017, 10:11 AM Pager: [phone] Consults: [phone] Mon-Fri 7:00 am-4:30 pm Sat-Sun 7:00 am-11:30 am

## 2017-09-04 NOTE — Progress Notes (Signed)
HPI 27 year old male former smoker followed for OSA complicated by morbid obesity, bigeminal rhythm, hyponatremia HST 06/18/17-AHI 42.7/hour, desaturation to 77%, body weight 400 pounds --------------------------------------------------------------------------------  HPI10/22/18- 27 year old male former smoker for sleep evaluation.Sleep Consult; courtesy of Arva ChafeNicholas Wendling, DO. witnessed apenic episodes by wife-gasping for air. Never had sleep study. Medical problem list includes morbid obesity, bigeminal rhythm, hyponatremia Epworth 5 Wife tells him he snores loudly and gasps for breath and his sleep. He admits drowsiness if he sits quietly. Frequent waking at night. No sleep medicines, no caffeine. He denies parasomnias. ENT surgery-tonsils. Breathing is okay through his nose. Not aware of heart or lung problems except that he had bigeminy in the past. 2 uncles had sleep apnea.  09/04/17-27 year old male former smoker followed for OSA complicated by morbid obesity, bigeminal rhythm, hyponatremia HST 06/18/17-AHI 42.7/hour, desaturation to 77%, body weight 400 pounds CPAP auto 5-20/ [facility]  -----OSA/CPAP follow up  Here with his wife.  He is working on mask comfort and needs to reduce leak.  CPAP download 36% compliance, AHI 4.1/hour.  He tries it most nights but takes it off because of the leak.  Wife confirms CPAP stops his snoring.  ROS-see HPI  + = positive Constitutional:    weight loss, night sweats, fevers, chills, +fatigue, lassitude. HEENT:    headaches, difficulty swallowing, tooth/dental problems, sore throat,       sneezing, itching, ear ache, nasal congestion, post nasal drip, snoring CV:    chest pain, orthopnea, PND, swelling in lower extremities, anasarca,                                                   dizziness, palpitations Resp:   +shortness of breath with exertion or at rest.                productive cough,   non-productive cough, coughing up of  blood.              change in color of mucus.  wheezing.   Skin:    rash or lesions. GI:  No-   heartburn, indigestion, abdominal pain, nausea, vomiting, diarrhea,                 change in bowel habits, loss of appetite GU: dysuria, change in color of urine, no urgency or frequency.   flank pain. MS:   +joint pain, stiffness, decreased range of motion, back pain. Neuro-     nothing unusual Psych:  change in mood or affect.  depression or anxiety.   memory loss.  OBJ- Physical Exam General- Alert, Oriented, Affect-appropriate, Distress- none acute, + morbidly obese Skin- rash-none, lesions- none, excoriation- none, + tatoos Lymphadenopathy- none Head- atraumatic            Eyes- Gross vision intact, PERRLA, conjunctivae and secretions clear            Ears- Hearing, canals-normal            Nose- Clear, no-Septal dev, mucus, polyps, erosion, perforation             Throat- Mallampati IV , mucosa clear , drainage- none, tonsils- atrophic, + own teeth Neck- flexible , trachea midline, no stridor , thyroid nl, carotid no bruit Chest - symmetrical excursion , unlabored           Heart/CV-  RRR , no murmur , no gallop  , no rub, nl s1 s2                           - JVD- none , edema- none, stasis changes- none, varices- none           Lung- clear to P&A, wheeze- none, cough+ light, dullness-none, rub- none           Chest wall-  Abd-  Br/ Gen/ Rectal- Not done, not indicated Extrem- cyanosis- none, clubbing, none, atrophy- none, strength- nl Neuro- grossly intact to observation

## 2017-09-05 NOTE — Assessment & Plan Note (Signed)
Promising initial reports from wife that CPAP stops his snoring and he seems to sleep more comfortably with it.  He says once pressure builds over 15, leak is disruptive. Plan-reduce AutoPap range to 5-15, ask DME to refit mask.  Educated on compliance goals.

## 2017-09-05 NOTE — Assessment & Plan Note (Signed)
Hopefully he will be willing to meet with bariatric counselors.  Multiple medical problems dependent on him achieving meaningful weight loss.

## 2017-09-25 DIAGNOSIS — Z713 Dietary counseling and surveillance: Secondary | ICD-10-CM | POA: Diagnosis not present

## 2017-10-13 ENCOUNTER — Other Ambulatory Visit: Payer: Self-pay | Admitting: Family Medicine

## 2017-10-14 ENCOUNTER — Ambulatory Visit: Payer: BLUE CROSS/BLUE SHIELD | Admitting: Family Medicine

## 2017-10-14 ENCOUNTER — Encounter: Payer: Self-pay | Admitting: Family Medicine

## 2017-10-14 VITALS — BP 134/80 | HR 102 | Temp 98.7°F | Ht 70.0 in | Wt 394.2 lb

## 2017-10-14 DIAGNOSIS — H04302 Unspecified dacryocystitis of left lacrimal passage: Secondary | ICD-10-CM

## 2017-10-14 MED ORDER — POLYMYXIN B-TRIMETHOPRIM 10000-0.1 UNIT/ML-% OP SOLN: 1.0000 [drp] | OPHTHALMIC | 0 refills | Status: AC

## 2017-10-14 NOTE — Progress Notes (Signed)
Pre visit review using our clinic review tool, if applicable. No additional management support is needed unless otherwise documented below in the visit note. 

## 2017-10-14 NOTE — Patient Instructions (Addendum)
Artificial tears like Refresh and Systane may be used for comfort. OK to get generic version. Generally people use them every 2-4 hours, but you can use them as much as you want because there is no medication in it.  Drop the form off at your earliest convenience.  Let us know if you need anything.

## 2017-10-14 NOTE — Progress Notes (Signed)
Chief Complaint  Patient presents with  . Follow-up    Roger Salinas is a 27 y.o. male here for a skin complaint.  Duration: 2 days Location: Lower eyelid Pruritic? No Painful? Yes Drainage? Yes New soaps/lotions/topicals/detergents? No Sick contacts? No Other associated symptoms: swelling, drainage from eye, tearing Therapies tried thus far: warm compresses  Here for wt check regarding bariatric surgery insurance requirements. He has been trying to lose weight for several years now. He has been following with our office for around 6 mo.  ROS:  Const: No fevers Skin: As noted in HPI  Past Medical History:  Diagnosis Date  . Essential hypertension 07/03/2017  . Obstructive sleep apnea 06/03/2017   No Known Allergies Allergies as of 10/14/2017   No Known Allergies     Medication List        Accurate as of 10/14/17  4:23 PM. Always use your most recent med list.          amLODipine 5 MG tablet Commonly known as:  NORVASC Take 1 tablet (5 mg total) by mouth daily.   cholecalciferol 1000 units tablet Commonly known as:  VITAMIN D Take 1,000 Units by mouth 2 (two) times daily.   Diclofenac Sodium 2 % Soln Commonly known as:  PENNSAID Apply thin layer over feet 4 times daily as needed.   trimethoprim-polymyxin b ophthalmic solution Commonly known as:  POLYTRIM Place 1 drop into the left eye every 4 (four) hours for 5 days.   valsartan 320 MG tablet Commonly known as:  DIOVAN TAKE 1 TABLET BY MOUTH EVERY DAY       BP 134/80 (BP Location: Left Arm, Patient Position: Sitting, Cuff Size: Large)   Pulse (!) 102   Temp 98.7 F (37.1 C) (Oral)   Ht 5\' 10"  (1.778 m)   Wt (!) 394 lb 4 oz (178.8 kg)   SpO2 95%   BMI 56.57 kg/m  Gen: awake, alert, appearing stated age Lungs: No accessory muscle use. CTAB Heart: RRR Skin: See below.  No drainage, TTP, fluctuance, excoriation Psych: Age appropriate judgment and insight  Dacrocystitis, left - Plan:  trimethoprim-polymyxin b (POLYTRIM) ophthalmic solution  Morbid obesity (HCC)  Orders as above. Artificial tears.  He has form for insurance to verify that he has been attempting to lose weight for >1 year. F/u prn. The patient voiced understanding and agreement to the plan.  [name] JunctionWendling, DO 10/14/17 4:23 PM

## 2017-10-23 DIAGNOSIS — Z713 Dietary counseling and surveillance: Secondary | ICD-10-CM | POA: Diagnosis not present

## 2017-10-23 DIAGNOSIS — Z6841 Body Mass Index (BMI) 40.0 and over, adult: Secondary | ICD-10-CM | POA: Diagnosis not present

## 2017-10-24 DIAGNOSIS — I10 Essential (primary) hypertension: Secondary | ICD-10-CM | POA: Diagnosis not present

## 2017-10-24 DIAGNOSIS — E559 Vitamin D deficiency, unspecified: Secondary | ICD-10-CM | POA: Diagnosis not present

## 2017-10-24 DIAGNOSIS — Z6841 Body Mass Index (BMI) 40.0 and over, adult: Secondary | ICD-10-CM | POA: Diagnosis not present

## 2017-10-24 DIAGNOSIS — R7303 Prediabetes: Secondary | ICD-10-CM | POA: Diagnosis not present

## 2017-10-24 DIAGNOSIS — E7800 Pure hypercholesterolemia, unspecified: Secondary | ICD-10-CM | POA: Diagnosis not present

## 2017-11-04 ENCOUNTER — Emergency Department (HOSPITAL_BASED_OUTPATIENT_CLINIC_OR_DEPARTMENT_OTHER)
Admission: EM | Admit: 2017-11-04 | Discharge: 2017-11-04 | Disposition: A | Discharge status: Home / Self Care | Payer: BLUE CROSS/BLUE SHIELD | Attending: Emergency Medicine | Admitting: Emergency Medicine

## 2017-11-04 ENCOUNTER — Ambulatory Visit: Payer: Self-pay | Admitting: *Deleted

## 2017-11-04 ENCOUNTER — Other Ambulatory Visit: Payer: Self-pay

## 2017-11-04 ENCOUNTER — Encounter (HOSPITAL_BASED_OUTPATIENT_CLINIC_OR_DEPARTMENT_OTHER): Payer: Self-pay | Admitting: Emergency Medicine

## 2017-11-04 ENCOUNTER — Emergency Department (HOSPITAL_BASED_OUTPATIENT_CLINIC_OR_DEPARTMENT_OTHER): Payer: BLUE CROSS/BLUE SHIELD

## 2017-11-04 DIAGNOSIS — R51 Headache: Secondary | ICD-10-CM | POA: Diagnosis not present

## 2017-11-04 DIAGNOSIS — I10 Essential (primary) hypertension: Secondary | ICD-10-CM | POA: Insufficient documentation

## 2017-11-04 DIAGNOSIS — Z79899 Other long term (current) drug therapy: Secondary | ICD-10-CM | POA: Diagnosis not present

## 2017-11-04 DIAGNOSIS — R519 Headache, unspecified: Secondary | ICD-10-CM

## 2017-11-04 DIAGNOSIS — Z87891 Personal history of nicotine dependence: Secondary | ICD-10-CM | POA: Diagnosis not present

## 2017-11-04 DIAGNOSIS — R42 Dizziness and giddiness: Secondary | ICD-10-CM | POA: Insufficient documentation

## 2017-11-04 MED ORDER — KETOROLAC TROMETHAMINE 15 MG/ML IJ SOLN
15.0000 mg | Freq: Once | INTRAMUSCULAR | Status: AC
  Administered 2017-11-04: 15 mg via INTRAVENOUS
  Filled 2017-11-04: qty 1

## 2017-11-04 MED ORDER — SODIUM CHLORIDE 0.9 % IV SOLN: INTRAVENOUS | Status: DC

## 2017-11-04 MED ORDER — PROCHLORPERAZINE EDISYLATE 5 MG/ML IJ SOLN
10.0000 mg | Freq: Once | INTRAMUSCULAR | Status: AC
  Administered 2017-11-04: 10 mg via INTRAVENOUS
  Filled 2017-11-04: qty 2

## 2017-11-04 MED ORDER — SODIUM CHLORIDE 0.9 % IV BOLUS
1000.0000 mL | Freq: Once | INTRAVENOUS | Status: AC
  Administered 2017-11-04: 1000 mL via INTRAVENOUS

## 2017-11-04 NOTE — ED Provider Notes (Signed)
[facility] EMERGENCY DEPARTMENT Provider Note   CSN: [id number] Arrival date & time: 11/04/17  1019     History   Chief Complaint Chief Complaint  Patient presents with  . Headache    HPI Roger Salinas is a 27 y.o. male with a hx of HTN and OSA who presents to the ED with complaint of headache that started this AM at 0800. Patient describes the pain as starting somewhat suddenly in the R posterior aspect of his head. States it is a sharp sensation. It has been waxing/waning since onset. States it is a 6/10 in severity at present, improved following tylenol. States he has noted some mild lightheadedness when he goes from sitting to standing, this is improving, he has experienced this previously. No syncope. Denies change in vision, numbness, weakness, dizziness, neck stiffness, or fevers.    HPI  Past Medical History:  Diagnosis Date  . Essential hypertension 07/03/2017  . Obstructive sleep apnea 06/03/2017    Patient Active Problem List   Diagnosis Date Noted  . Appendicitis 09/02/2017  . Abdominal pain 09/02/2017  . Plantar fasciitis 08/26/2017  . Essential hypertension 07/03/2017  . Obstructive sleep apnea 06/03/2017  . Near syncope 09/01/2016  . Bigeminal rhythm 09/01/2016  . Morbid obesity (HCC) 09/01/2016    Past Surgical History:  Procedure Laterality Date  . TONSILLECTOMY          Home Medications    Prior to Admission medications   Medication Sig Start Date End Date Taking? Authorizing Provider  amLODipine (NORVASC) 5 MG tablet Take 1 tablet (5 mg total) by mouth daily. 08/28/17   Sharlene Dory, DO  cholecalciferol (VITAMIN D) 1000 units tablet Take 1,000 Units by mouth 2 (two) times daily.    [provider]  Diclofenac Sodium (PENNSAID) 2 % SOLN Apply thin layer over feet 4 times daily as needed. Patient taking differently: Apply thin layer over feet 4 times daily 08/26/17   Sharlene Dory, DO  valsartan  (DIOVAN) 320 MG tablet TAKE 1 TABLET BY MOUTH EVERY DAY 10/14/17   Sharlene Dory, DO    Family History Family History  Problem Relation Age of Onset  . Hypertension Mother   . Diabetes Mother   . Diabetes Maternal Uncle   . Diabetes Maternal Grandmother   . Diabetes Maternal Uncle   . Heart attack Neg Hx   . Hyperlipidemia Neg Hx   . Sudden death Neg Hx     Social History Social History   Tobacco Use  . Smoking status: Former Smoker    Packs/day: 1.00    Years: 12.00    Pack years: 12.00    Types: Cigarettes    Start date: 2006    Last attempt to quit: 04/15/2017    Years since quitting: 0.5  . Smokeless tobacco: Never Used  Substance Use Topics  . Alcohol use: No    Frequency: Never  . Drug use: No     Allergies   Patient has no known allergies.   Review of Systems Review of Systems  Constitutional: Negative for chills and fever.  Eyes: Negative for visual disturbance.  Respiratory: Negative for shortness of breath.   Cardiovascular: Negative for chest pain.  Neurological: Positive for light-headedness (mild when transitioning from sitting to standing) and headaches. Negative for dizziness, syncope, speech difficulty, weakness and numbness.  All other systems reviewed and are negative.  Physical Exam Updated Vital Signs BP (!) 155/114 (BP Location: Right Arm)   Pulse  75   Temp 98.3 F (36.8 C) (Oral)   Resp 18   Ht 5\' 10"  (1.778 m)   Wt (!) 178.7 kg (394 lb)   SpO2 98%   BMI 56.53 kg/m   Physical Exam  Constitutional: He appears well-developed and well-nourished.  Non-toxic appearance. No distress.  HENT:  Head: Normocephalic and atraumatic.  Right Ear: Tympanic membrane normal.  Left Ear: Tympanic membrane normal.  Nose: Nose normal.  Mouth/Throat: Uvula is midline. No oropharyngeal exudate or posterior oropharyngeal erythema.  Eyes: Pupils are equal, round, and reactive to light. Conjunctivae and EOM are normal. Right eye exhibits no  discharge. Left eye exhibits no discharge.  Neck: Normal range of motion. Neck supple. No spinous process tenderness and no muscular tenderness present. No neck rigidity.  Cardiovascular: Normal rate and regular rhythm.  No murmur heard. Pulmonary/Chest: Breath sounds normal. No respiratory distress. He has no wheezes. He has no rhonchi. He has no rales.  Abdominal: Soft. He exhibits no distension. There is no tenderness.  Neurological:  Alert. Clear speech. No facial droop. CNIII-XII are intact. Bilateral upper and lower extremities' sensation grossly intact. 5/5 grip strength bilaterally. 5/5 plantar and dorsi flexion bilaterally. Patellar DTRs are 2+ and symmetric . Normal finger to nose bilaterally. Negative pronator drift. Negative Romberg sign. Gait is steady.   Skin: Skin is warm and dry. No rash noted.  Psychiatric: He has a normal mood and affect. His behavior is normal.  Nursing note and vitals reviewed.   ED Treatments / Results  Labs (all labs ordered are listed, but only abnormal results are displayed) Labs Reviewed - No data to display  EKG None  Radiology Ct Head Wo Contrast  Result Date: 11/04/2017 CLINICAL DATA:  Patient with headache. Sharp pain to the back of the head. Dizziness. EXAM: CT HEAD WITHOUT CONTRAST TECHNIQUE: Contiguous axial images were obtained from the base of the skull through the vertex without intravenous contrast. COMPARISON:  None. FINDINGS: Brain: Ventricles and sulci are appropriate for patient's age. No evidence for acute cortically based infarct, intracranial hemorrhage, mass lesion or mass-effect. Vascular: Unremarkable. Skull: Intact. Sinuses/Orbits: Polypoid mucosal thickening involving the ethmoid air cells and sphenoid sinus. Mastoid air cells are unremarkable. Orbits are unremarkable. Other: None. IMPRESSION: No acute intracranial process. Electronically Signed   By: Annia Beltrew  Davis M.D.   On: 11/04/2017 11:30    Procedures Procedures  (including critical care time)  Medications Ordered in ED Medications  sodium chloride 0.9 % bolus 1,000 mL (1,000 mLs Intravenous Given 11/04/17 1154)    And  0.9 %  sodium chloride infusion (has no administration in time range)  ketorolac (TORADOL) 15 MG/ML injection 15 mg (15 mg Intravenous Given 11/04/17 1154)  prochlorperazine (COMPAZINE) injection 10 mg (10 mg Intravenous Given 11/04/17 1154)   Initial Impression / Assessment and Plan / ED Course  I have reviewed the triage vital signs and the nursing notes.  Pertinent labs & imaging results that were available during my care of the patient were reviewed by me and considered in my medical decision making (see chart for details).    Patient presents with complaint of headache. Patient is nontoxic appearing, BP elevated, doubt hypertensive emergency. CT scan ordered given description of headache, CT head negative, CT was well within 6 hours, given low risk patient patient subarachnoid or intracranial hemorrhage excluded. Given presentation, doubt ischemic CVA, acute glaucoma, giant cell arteritis, mass, or meningitis. Patient is afebrile with no focal neuro deficits, dizziness, change in vision, or  nuchal rigidity. Patient treated for headache with fluids, toradol, and compazine with improvement.   12:50: RE-EVAL: Patient reports he feels much better and is ready to go home. Headache minimal, mild lightheadedness resolved.  I discussed treatment plan, need for PCP follow-up, and return precautions with the patient. Provided opportunity for questions, patient confirmed understanding and is in agreement with plan.    Final Clinical Impressions(s) / ED Diagnoses   Final diagnoses:  Acute nonintractable headache, unspecified headache type    ED Discharge Orders    None       Cherly Anderson, PA-C 11/04/17 1324    Palumbo, April, MD 11/04/17 1401

## 2017-11-04 NOTE — ED Notes (Signed)
ED Provider at bedside. 

## 2017-11-04 NOTE — ED Triage Notes (Signed)
Patient reports headache that began today.  Reports "sharp pain" in the back of his head when he goes from sitting to standing.  Additionally complains of dizziness.

## 2017-11-04 NOTE — Discharge Instructions (Addendum)
Your seen in the emergency department today for a headache.  Your CT scan did not show any findings of a brain bleed or masses.  You were given fluids, Toradol (anti-inflammatory medicine), and Compazine, antinausea medicine) with improvement of your headache.  To need to take over-the-counter dosing of Tylenol and Motrin as needed.  Let with your primary care doctor in the next 3-5 days for reevaluation.  Return to the emergency department for any new or worsening symptoms including but not limited to worsening of your pain, numbness, weakness, trouble walking, change in your vision, inability to move your neck, fever, passing out, or any other concerns that she may have.

## 2017-11-04 NOTE — Telephone Encounter (Signed)
Pt triaged to ED per note.

## 2017-11-04 NOTE — Telephone Encounter (Signed)
Pt reports generalized headache 6/10 with shooting pain back of head, right sided. Does not radiate to neck.  Shooting pain 10/10, occurs every 10-15 minutes. Onset 8am this morning. Took 800 mg Motrin without relief. Denies any nausea/vomiting. States H/O HTN but has not checked his B/P this AM; did take B/P meds this am.   Also reports lightheadedness; positional, "feels like I'm going to pass out when I stand up."  Directed pt to ED. Co-worker will drive. Care advise given per protocol.  Reason for Disposition . [1] SEVERE headache (e.g., excruciating) AND [2] not improved after 2 hours of pain medicine  Answer Assessment - Initial Assessment Questions 1. LOCATION: "Where does it hurt?"      Right side, back of head shooting pain, generalized overall headache. 2. ONSET: "When did the headache start?" (Minutes, hours or days)      8am this morning; took 800 mg motrin, ineffective. 3. PATTERN: "Does the pain come and go, or has it been constant since it started?"     Intermittent shooting pain 10/10 4. SEVERITY: "How bad is the pain?" and "What does it keep you from doing?"  (e.g., Scale 1-10; mild, moderate, or severe)   - MILD (1-3): doesn't interfere with normal activities    - MODERATE (4-7): interferes with normal activities or awakens from sleep    - SEVERE (8-10): excruciating pain, unable to do any normal activities        10/10 shooting.Marland Kitchen.Marland Kitchen.Generalized headache 6/10 5. RECURRENT SYMPTOM: "Have you ever had headaches before?" If so, ask: "When was the last time?" and "What happened that time?"      No 6. CAUSE: "What do you think is causing the headache?"     Unsure 7. MIGRAINE: "Have you been diagnosed with migraine headaches?" If so, ask: "Is this headache similar?"      No 8. HEAD INJURY: "Has there been any recent injury to the head?"      No 9. OTHER SYMPTOMS: "Do you have any other symptoms?" (fever, stiff neck, eye pain, sore throat, cold symptoms)     Lightheaded with  positional changes; "feels like I'm about to pass out when I stand up."  Protocols used: HEADACHE-A-AH

## 2017-11-09 ENCOUNTER — Other Ambulatory Visit: Payer: Self-pay | Admitting: Family Medicine

## 2017-11-18 ENCOUNTER — Encounter: Payer: Self-pay | Admitting: Family Medicine

## 2017-11-25 ENCOUNTER — Other Ambulatory Visit: Payer: Self-pay | Admitting: Family Medicine

## 2017-11-25 DIAGNOSIS — I10 Essential (primary) hypertension: Secondary | ICD-10-CM

## 2017-12-12 ENCOUNTER — Other Ambulatory Visit: Payer: Self-pay | Admitting: Family Medicine

## 2017-12-19 ENCOUNTER — Ambulatory Visit: Payer: BLUE CROSS/BLUE SHIELD | Admitting: Family Medicine

## 2017-12-19 ENCOUNTER — Encounter: Payer: Self-pay | Admitting: Family Medicine

## 2017-12-19 VITALS — BP 138/82 | HR 86 | Temp 98.1°F | Ht 70.0 in | Wt 392.5 lb

## 2017-12-19 DIAGNOSIS — M7711 Lateral epicondylitis, right elbow: Secondary | ICD-10-CM

## 2017-12-19 DIAGNOSIS — M7701 Medial epicondylitis, right elbow: Secondary | ICD-10-CM

## 2017-12-19 DIAGNOSIS — M722 Plantar fascial fibromatosis: Secondary | ICD-10-CM | POA: Diagnosis not present

## 2017-12-19 MED ORDER — METHYLPREDNISOLONE ACETATE 40 MG/ML IJ SUSP
40.0000 mg | Freq: Once | INTRAMUSCULAR | Status: AC
  Administered 2017-12-19: 20 mg via INTRAMUSCULAR

## 2017-12-19 NOTE — Progress Notes (Signed)
Musculoskeletal Exam  Patient: Roger Salinas DOB: [birth year]  DOS: 12/19/2017  SUBJECTIVE:  Chief Complaint:   Chief Complaint  Patient presents with  . Foot Pain    left  . Arm Pain    right   Roger Salinas is a 27 y.o.  male for evaluation and treatment of L foot and R arm pain.   Onset:  1 month ago. No inj or change in activity.  Location: epicondyles Character:  aching and sharp  Progression of issue:  is unchanged Associated symptoms: Weak grip Treatment: to date has been ice.   Neurovascular symptoms: no  ROS: Musculoskeletal/Extremities: +R elbow pain  Past Medical History:  Diagnosis Date  . Essential hypertension 07/03/2017  . Obstructive sleep apnea 06/03/2017    Objective: VITAL SIGNS: BP 138/82 (BP Location: Left Arm, Patient Position: Sitting, Cuff Size: Large)   Pulse 86   Temp 98.1 F (36.7 C) (Oral)   Ht  (1.778 m)   Wt (!) 392 lb 8 oz (178 kg)   SpO2 96%   BMI 56.32 kg/m  Constitutional: Well formed, well developed. No acute distress. Cardiovascular: Brisk cap refill Thorax & Lungs: No accessory muscle use Musculoskeletal: R elbow.   Normal active range of motion: yes.   Normal passive range of motion: yes Tenderness to palpation: Yes, moderate ttp over lateral epicondyle, mild ttp over medial epicondyle Deformity: no Ecchymosis: no +pain with resisted wrist ext and flexion TTP over L plantar fascia  Neurologic: Normal sensory function. No focal deficits noted.  Psychiatric: Normal mood. Age appropriate judgment and insight. Alert & oriented x 3.    Procedure Note; Plantar fascia, L, injection Verbal consent obtained. The area of interest was palpated until the maximum tenderness was located And otoscope speculum was used to demarcate this area The area was cleaned with an alcohol swab and free spray was used A 30-gauge needle was used to enter perpendicularly with a plunger withdrawn to make sure there is no bleeding 20 mg  of Depomedrol with 0.5 mL of 2% lidocaine was injected. The patient tolerated the procedure well. There were no complications noted.   Assessment:  Lateral epicondylitis of right elbow  Medial epicondylitis of right elbow  Plantar fasciitis - Plan: methylPREDNISolone acetate (DEPO-MEDROL) injection 40 mg, Inject trigger point, 1 or 2  Plan: For the elbow, stretches/exercises were given for tennis elbow.  Forearm strap recommended.  Ice, Tylenol.  He cannot take anti-inflammatories due to possible surgery. Plantar fascia injected.  Encourage weight loss, ice, and counseled on the risks and benefits of the injection. F/u prn. The patient voiced understanding and agreement to the plan.   [name] [place], DO 12/19/17  11:15 AM

## 2017-12-19 NOTE — Patient Instructions (Addendum)
OK to take Tylenol 1000 mg (2 extra strength tabs) or 975 mg (3 regular strength tabs) every 6 hours as needed.  Ice/cold pack over area for 10-15 min twice daily. -over foot and elbow region.  Band-IT forearm strap, or any forearm/tennis elbow strap, can be helpful. Wear during day when you are using hands while working, etc.   Elbow and Forearm Exercises It is normal to feel mild stretching, pulling, tightness, or discomfort as you do these exercises, but you should stop right away if you feel sudden pain or your pain gets worse. RANGE OF MOTION EXERCISES These exercises warm up your muscles and joints and improve the movement and flexibility of your injured elbow and forearm. These exercises also help to relieve pain, numbness, and tingling.These exercises are done using the muscles in your injured elbow and forearm. Exercise A: Elbow Flexion, Active 1. Hold your left / right arm at your side, and bend your elbow as far as you can using your left / right arm muscles. 2. Hold this position for 30 seconds. 3. Slowly return to the starting position. Repeat 2 times. Complete this exercise 3 times per week. Exercise B: Elbow Extension, Active 1. Hold your left / right arm at your side, and straighten your elbow as much as you can using your left / right arm muscles. 2. Hold this position for 30 seconds. 3. Slowly return to the starting position. Repeat 2 times. Complete this exercise 3 times per week. Exercise C: Forearm Rotation, Supination, Active 1. Stand or sit with your elbows at your sides. 2. Bend your left / right elbow to an "L" shape (90 degrees). 3. Turn your palm upward until you feel a gentle stretch on the inside of your forearm. 4. Hold this position for 30 seconds. 5. Slowly release and return to the starting position. Repeat 2 times. Complete this exercise 3 times per week. Exercise D: Forearm Rotation, Pronation, Active 1. Stand or sit with your elbows at your  side. 2. Bend your left / right elbow to an "L" shape (90 degrees). 3. Turn your left / right palm downward until you feel a gentle stretch on the top of your forearm. 4. Hold this position for 30 seconds. 5. Slowly release and return to the starting position. Repeat2 times. Complete this exercise 3 times per week. STRETCHING EXERCISES These exercises warm up your muscles and joints and improve the movement and flexibility of your injured elbow and forearm. These exercises also help to relieve pain, numbness, and tingling.These exercises are done using your healthy elbow and forearm to help stretch the muscles in your injured elbow and forearm. Exercise E: Elbow Flexion, Active-Assisted  1. Hold your left / right arm at your side, and bend your elbow as much as you can using your left / right arm muscles. 2. Use your other hand to bend your left / right elbow farther. To do this, gently push up on your forearm until you feel a gentle stretch on the back of your elbow. 3. Hold this position for 30 seconds. 4. Slowly return to the starting position. Repeat 2 times. Complete this exercise 3 times per week. Exercise F: Elbow Extension, Active-Assisted  1. Hold your left / right arm at your side, and straighten your elbow as much as you can using your left / right arm muscles. 2. Use your other hand to straighten the left / right elbow farther. To do this, gently push down on your forearm until you  feel a gentle stretch on the inside of your elbow. 3. Hold this position for 30 seconds. 4. Slowly return to the starting position. Repeat 2 times. Complete this exercise 3 times per weeky. Exercise G: Forearm Rotation, Supination, Active-Assisted  1. Sit with your left / right elbow bent in an "L" shape (90 degrees) with your forearm resting on a table. 2. Keeping your upper body and shoulder still, rotate your forearm so your left / right palm faces upward. 3. Use your other hand to help rotate  your forearm further until you feel a gentle to moderate stretch. 4. Hold this position for 30 seconds. 5. Slowly release the stretch and return to the starting position. Repeat 2 times. Complete this exercise 3 times per week. Exercise H: Forearm Rotation, Pronation, Active-Assisted  1. Sit with your left / right elbow bent in an "L" shape (90 degrees) with your forearm resting on a table. 2. Keeping your upper body and shoulder still, rotate your forearm so your palm faces the tabletop. 3. Use your other hand to help rotate your forearm further until you feel a gentle to moderate stretch. 4. Hold this position for 30 seconds. 5. Slowly release the stretch and return to the starting position. Repeat 2 times. Complete this exercise 3 times per week. Exercise I: Elbow Flexion, Supine, Passive 1. Lie on your back. 2. Extend your left / right arm up in the air, bracing it with your other hand. 3. Let your left / right your hand slowly lower toward your shoulder, while your elbow stays pointed toward the ceiling. You should feel a gentle stretch along the back of your upper arm and elbow. 4. If instructed by your health care provider, you may increase the intensity of your stretch by adding a small wrist weight or hand weight. 5. Hold this position for 3 seconds. 6. Slowly return to the starting position. Repeat 2 times. Complete this exercise 3 times per week. Exercise J: Elbow Extension, Supine, Passive  1. Lie on your back. Make sure that you are in a comfortable position that lets you relax your arm muscles. 2. Place a folded towel under your left / right upper arm so your elbow and shoulder are at the same height. Straighten your left / right arm so your elbow does not rest on the bed or towel. 3. Let the weight of your hand stretch your elbow. Keep your arm and chest muscles relaxed. You should feel a stretch on the inside of your elbow. 4. If told by your health care provider, you may  increase the intensity of your stretch by adding a small wrist weight or hand weight. 5. Hold this position for 30 seconds. 6. Slowly release the stretch. Repeat 2 times. Complete this exercise 3 times per week. STRENGTHENING EXERCISES These exercises build strength and endurance in your elbow and forearm. Endurance is the ability to use your muscles for a long time, even after they get tired. Exercise K: Elbow Flexion, Isometric  1. Stand or sit up straight. 2. Bend your left / right elbow in an "L" shape (90 degrees) and turn your palm up so your forearm is at the height of your waist. 3. Place your other hand on top of your forearm. Gently push down as your left / right arm resists. Push as hard as you can with both arms without causing any pain or movement at your left / right elbow. 4. Hold this position for 3 seconds. 5. Slowly release  the tension in both arms. Let your muscles relax completely before repeating. Repeat 2 times. Complete this exercise 3 times per week. Exercise L: Elbow Extensors, Isometric  1. Stand or sit up straight. 2. Place your left / right arm so your palm faces your abdomen and it is at the height of your waist. 3. Place your other hand on the underside of your forearm. Gently push up as your left / right arm resists. Push as hard as you can with both arms, without causing any pain or movement at your left / right elbow. 4. Hold this position for 3 seconds. 5. Slowly release the tension in both arms. Let your muscles relax completely before repeating. Repeat _______2___ times. Complete this exercise 3 times per week. Exercise M: Elbow Flexion With Forearm Palm Up  1. Sit upright on a firm chair without armrests, or stand. 2. Place your left / right arm at your side with your palm facing forward. 3. Holding a 5 lbweight or gripping a rubber exercise band or tubing, bend your elbow to bring your hand toward your shoulder. 4. Hold this position for 3  seconds. 5. Slowly return to the starting position. Repeat 2 times. Complete this exercise 3 times per week. Exercise N: Elbow Extension  1. Sit on a firm chair without armrests, or stand. 2. Keeping your upper arms at your sides, bring both hands up toward your left / right shoulder while you grip a rubber exercise band or tubing. Your left / right hand should be just below the other hand. 3. Straighten your left / right elbow. 4. Hold this position for 3 seconds. 5. Control the resistance of the band or tubing as your hand returns to your side. Repeat 2 times. Complete this exercise 3 times per week. Exercise O: Forearm Rotation, Supination  1. Sit with your left / right forearm supported on a table. Keep your elbow at waist height. 2. Rest your hand over the edge of the table with your palm facing down. 3. Gently hold a lightweight hammer. 4. Without moving your elbow, slowly rotate your forearm to turn your palm and hand upward to a "thumbs-up" position. 5. Hold this position for 3 seconds. 6. Slowly return to the starting position. Repeat 2 times. Complete this exercise 3 times per week. Exercise P: Forearm Rotation, Pronation  1. Sit with your left / right forearm supported on a table. Keep your elbow below shoulder height. 2. Rest your hand over the edge of the table with your palm facing up. 3. Gently hold a lightweight hammer. 4. Without moving your elbow, slowly rotate your forearm to turn your palm and hand upward to a "thumbs-up" position. 5. Hold this position for 3 seconds. 6. Slowly return to the starting position. Repeat 2 times. Complete this exercise 3 times per week.  Make sure you discuss any questions you have with your health care provider. Document Released: 06/13/2005 Document Revised: 12/08/2015 Document Reviewed: 04/24/2015 Elsevier Interactive Patient Education  [employer].

## 2017-12-19 NOTE — Progress Notes (Signed)
Pre visit review using our clinic review tool, if applicable. No additional management support is needed unless otherwise documented below in the visit note. 

## 2018-01-08 ENCOUNTER — Ambulatory Visit: Payer: BLUE CROSS/BLUE SHIELD | Admitting: Internal Medicine

## 2018-01-09 DIAGNOSIS — E7800 Pure hypercholesterolemia, unspecified: Secondary | ICD-10-CM | POA: Diagnosis not present

## 2018-01-09 DIAGNOSIS — Z6841 Body Mass Index (BMI) 40.0 and over, adult: Secondary | ICD-10-CM | POA: Diagnosis not present

## 2018-01-09 DIAGNOSIS — I10 Essential (primary) hypertension: Secondary | ICD-10-CM | POA: Diagnosis not present

## 2018-01-15 DIAGNOSIS — F54 Psychological and behavioral factors associated with disorders or diseases classified elsewhere: Secondary | ICD-10-CM | POA: Diagnosis not present

## 2018-01-15 DIAGNOSIS — Z6841 Body Mass Index (BMI) 40.0 and over, adult: Secondary | ICD-10-CM | POA: Diagnosis not present

## 2018-01-15 DIAGNOSIS — Z7189 Other specified counseling: Secondary | ICD-10-CM | POA: Diagnosis not present

## 2018-01-24 ENCOUNTER — Encounter: Payer: Self-pay | Admitting: Family Medicine

## 2018-01-24 DIAGNOSIS — I10 Essential (primary) hypertension: Secondary | ICD-10-CM

## 2018-01-24 MED ORDER — VALSARTAN 320 MG PO TABS: 320.0000 mg | ORAL_TABLET | Freq: Every day | ORAL | 2 refills | Status: DC

## 2018-01-24 MED ORDER — AMLODIPINE BESYLATE 5 MG PO TABS: 5.0000 mg | ORAL_TABLET | Freq: Every day | ORAL | 2 refills | Status: DC

## 2018-02-06 DIAGNOSIS — I10 Essential (primary) hypertension: Secondary | ICD-10-CM | POA: Diagnosis not present

## 2018-02-06 DIAGNOSIS — Z6841 Body Mass Index (BMI) 40.0 and over, adult: Secondary | ICD-10-CM | POA: Diagnosis not present

## 2018-02-06 DIAGNOSIS — E7800 Pure hypercholesterolemia, unspecified: Secondary | ICD-10-CM | POA: Diagnosis not present

## 2018-02-19 IMAGING — CR DG CHEST 1V PORT
2 series · 2 of 2 positions shown · non-contrast
Comparison: 11/02/2014

CLINICAL DATA: Near syncope

EXAM:
PORTABLE CHEST 1 VIEW

[AP (1 of 2)]
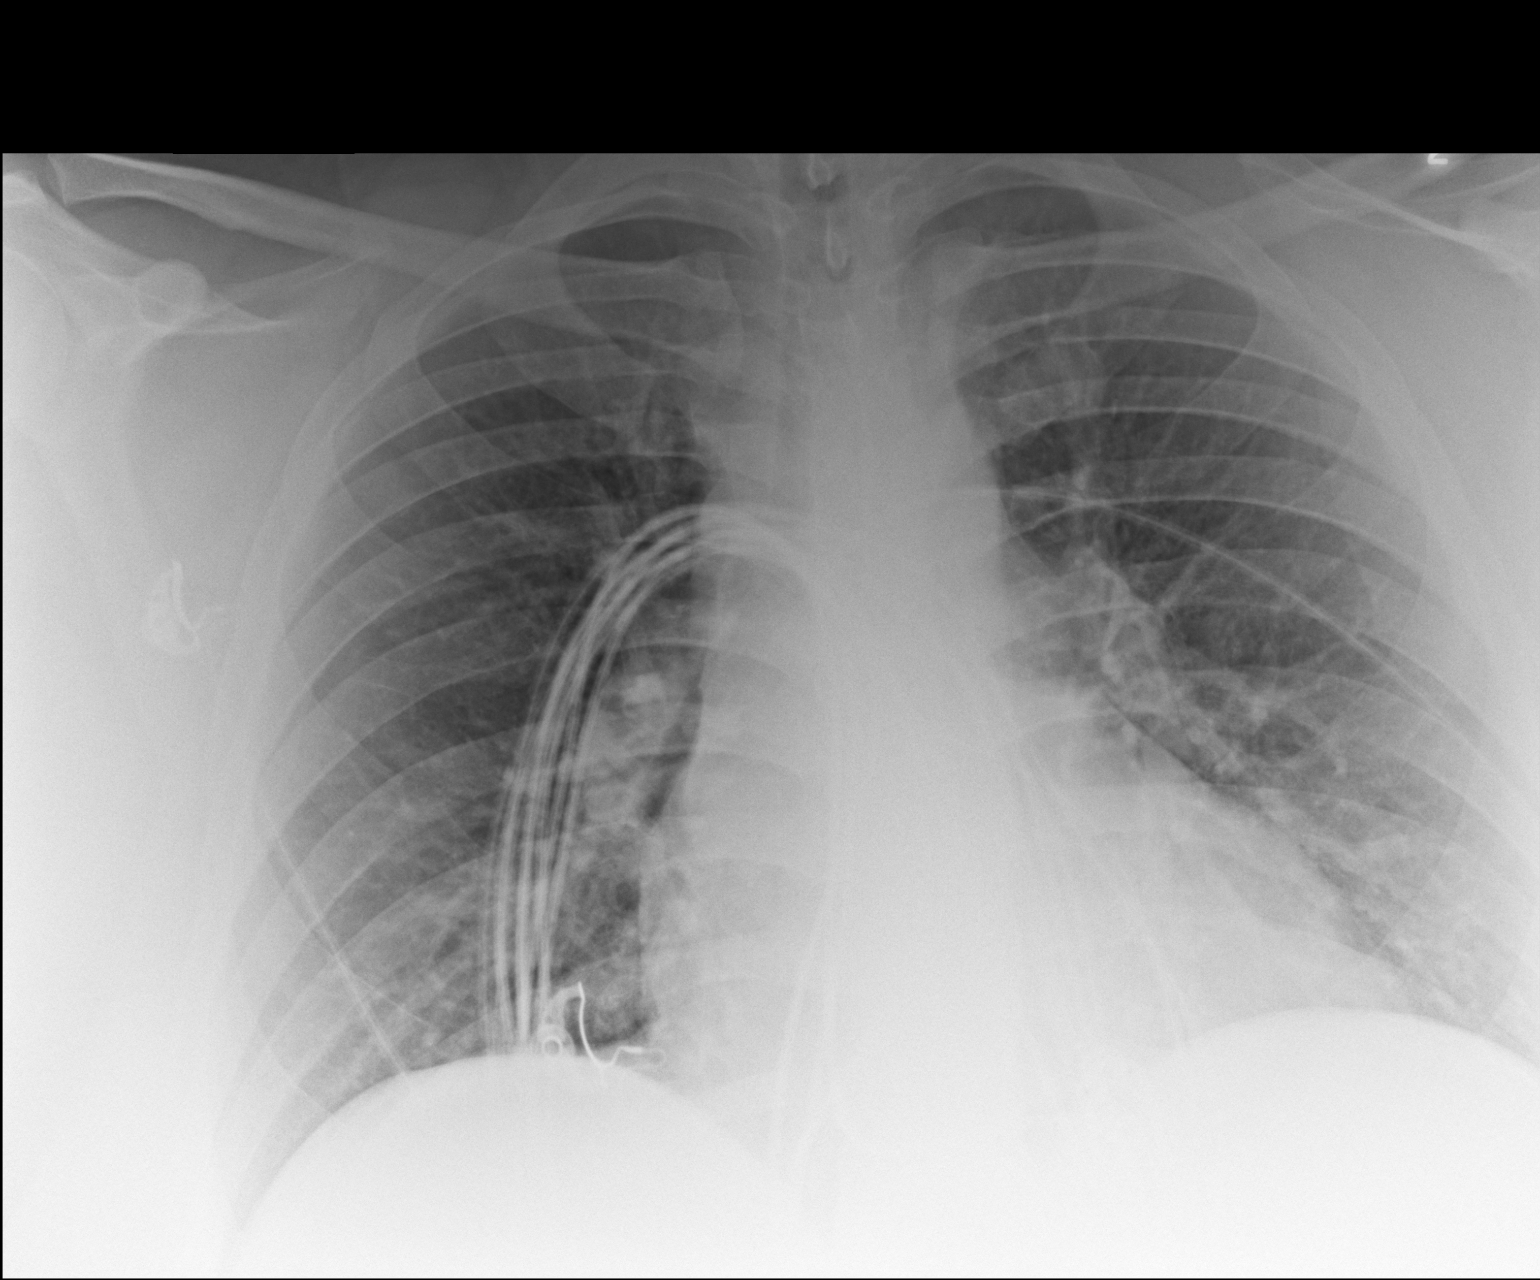

[AP (2 of 2)]
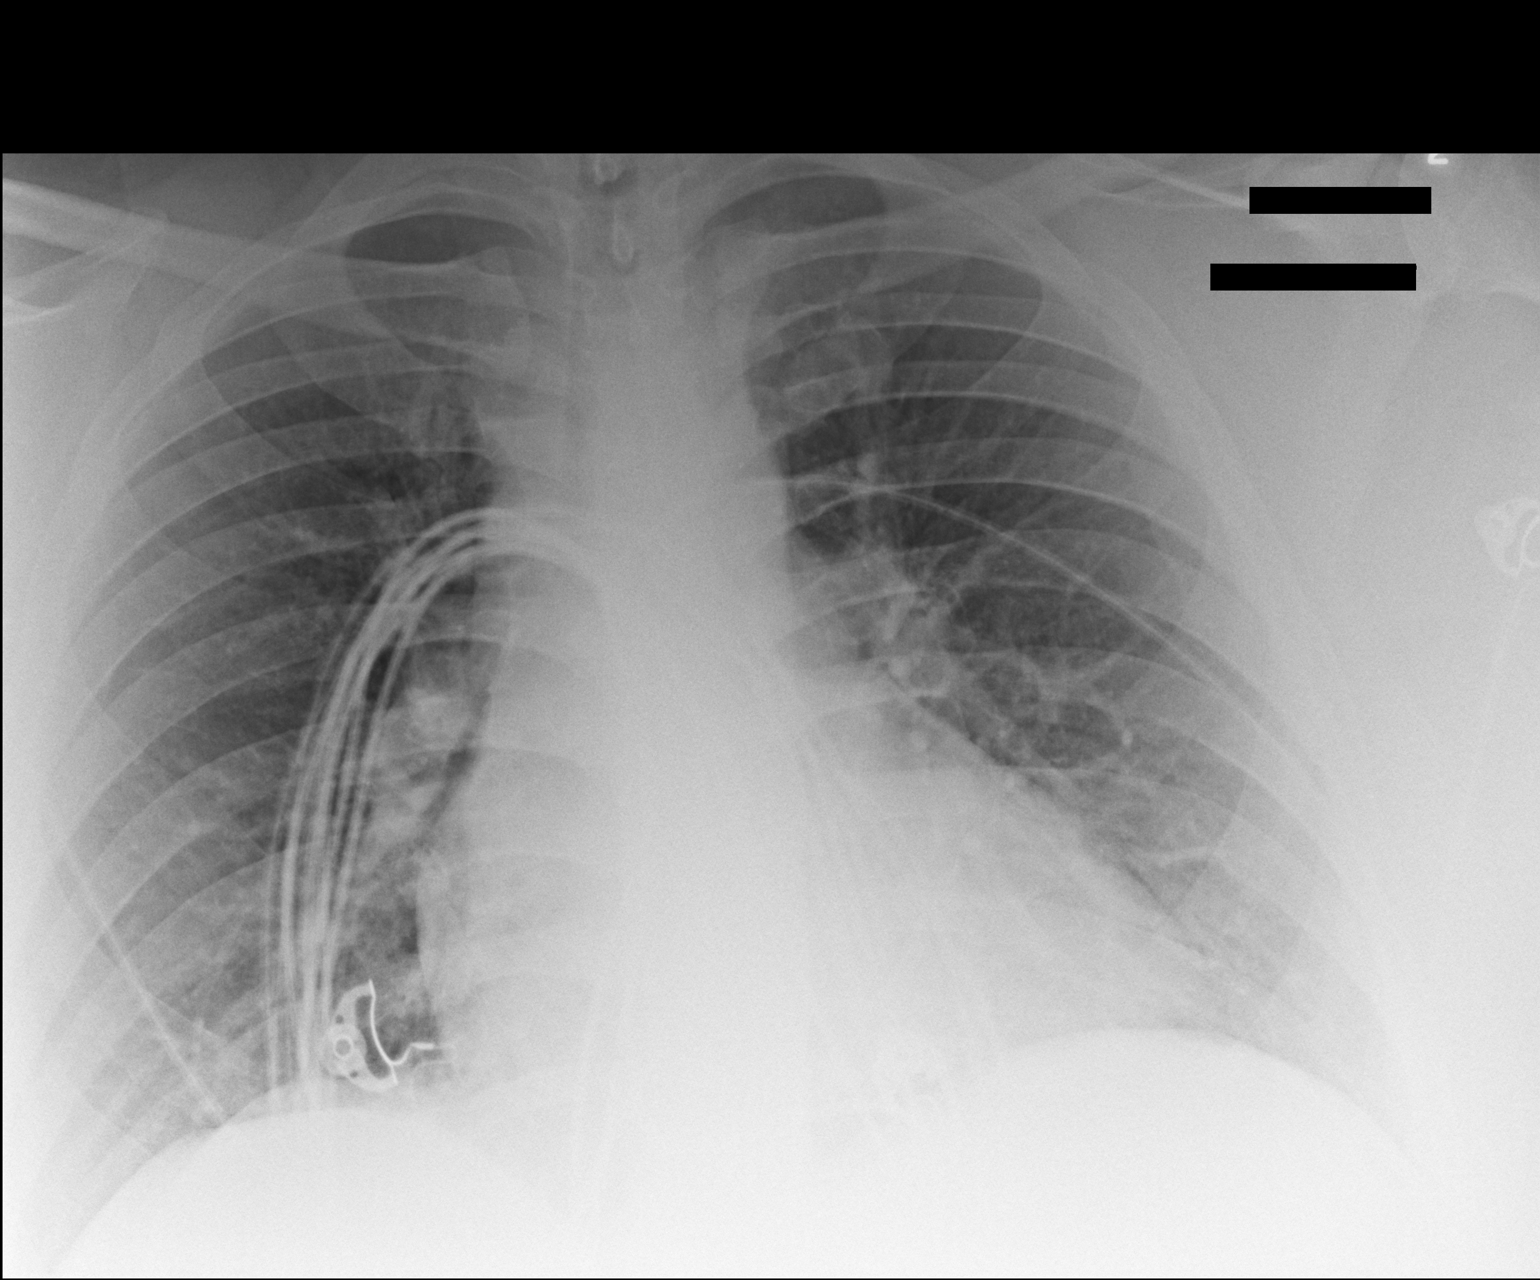

[2 of 2 positions shown; findings below may reference images not displayed]

FINDINGS: The heart is borderline enlarged. Lungs are clear. No pneumothorax
or pleural effusion.
IMPRESSION: No active disease.

## 2018-02-24 ENCOUNTER — Telehealth: Payer: Self-pay | Admitting: Family Medicine

## 2018-02-24 NOTE — Telephone Encounter (Signed)
Patient dropped off bariatric form to be completed by wendling. Please fax doc to [phone]. Notify patient when completed to pick up original copies.

## 2018-02-25 NOTE — Telephone Encounter (Signed)
Completed as much as possible on [facility] Documentation of 12 Consecutive Months of Weight Loss Attempts; forwarded to provider/SLS 07/16

## 2018-02-26 ENCOUNTER — Encounter: Payer: Self-pay | Admitting: Family Medicine

## 2018-02-26 DIAGNOSIS — E7800 Pure hypercholesterolemia, unspecified: Secondary | ICD-10-CM | POA: Diagnosis not present

## 2018-02-26 DIAGNOSIS — K2950 Unspecified chronic gastritis without bleeding: Secondary | ICD-10-CM | POA: Diagnosis not present

## 2018-02-26 DIAGNOSIS — I10 Essential (primary) hypertension: Secondary | ICD-10-CM | POA: Diagnosis not present

## 2018-02-26 DIAGNOSIS — Z87891 Personal history of nicotine dependence: Secondary | ICD-10-CM | POA: Diagnosis not present

## 2018-02-26 DIAGNOSIS — E559 Vitamin D deficiency, unspecified: Secondary | ICD-10-CM | POA: Diagnosis not present

## 2018-02-26 DIAGNOSIS — K2970 Gastritis, unspecified, without bleeding: Secondary | ICD-10-CM | POA: Diagnosis not present

## 2018-02-26 DIAGNOSIS — Z888 Allergy status to other drugs, medicaments and biological substances status: Secondary | ICD-10-CM | POA: Diagnosis not present

## 2018-02-26 DIAGNOSIS — R7303 Prediabetes: Secondary | ICD-10-CM | POA: Diagnosis not present

## 2018-02-26 DIAGNOSIS — G4730 Sleep apnea, unspecified: Secondary | ICD-10-CM | POA: Diagnosis not present

## 2018-02-26 DIAGNOSIS — Z6841 Body Mass Index (BMI) 40.0 and over, adult: Secondary | ICD-10-CM | POA: Diagnosis not present

## 2018-03-18 DIAGNOSIS — I10 Essential (primary) hypertension: Secondary | ICD-10-CM | POA: Diagnosis not present

## 2018-03-18 DIAGNOSIS — Z01818 Encounter for other preprocedural examination: Secondary | ICD-10-CM | POA: Diagnosis not present

## 2018-03-18 DIAGNOSIS — E7800 Pure hypercholesterolemia, unspecified: Secondary | ICD-10-CM | POA: Diagnosis not present

## 2018-03-18 DIAGNOSIS — Z6841 Body Mass Index (BMI) 40.0 and over, adult: Secondary | ICD-10-CM | POA: Diagnosis not present

## 2018-03-26 DIAGNOSIS — Z6841 Body Mass Index (BMI) 40.0 and over, adult: Secondary | ICD-10-CM | POA: Diagnosis not present

## 2018-03-26 DIAGNOSIS — R7303 Prediabetes: Secondary | ICD-10-CM | POA: Diagnosis not present

## 2018-03-26 DIAGNOSIS — Z888 Allergy status to other drugs, medicaments and biological substances status: Secondary | ICD-10-CM | POA: Diagnosis not present

## 2018-03-26 DIAGNOSIS — I10 Essential (primary) hypertension: Secondary | ICD-10-CM | POA: Diagnosis not present

## 2018-03-26 DIAGNOSIS — G4733 Obstructive sleep apnea (adult) (pediatric): Secondary | ICD-10-CM | POA: Diagnosis not present

## 2018-03-26 DIAGNOSIS — E559 Vitamin D deficiency, unspecified: Secondary | ICD-10-CM | POA: Diagnosis not present

## 2018-03-26 DIAGNOSIS — E7800 Pure hypercholesterolemia, unspecified: Secondary | ICD-10-CM | POA: Diagnosis not present

## 2018-03-26 DIAGNOSIS — Z87891 Personal history of nicotine dependence: Secondary | ICD-10-CM | POA: Diagnosis not present

## 2018-03-26 DIAGNOSIS — Z79899 Other long term (current) drug therapy: Secondary | ICD-10-CM | POA: Diagnosis not present

## 2018-03-27 DIAGNOSIS — K5669 Other partial intestinal obstruction: Secondary | ICD-10-CM | POA: Diagnosis not present

## 2018-03-27 DIAGNOSIS — Z87891 Personal history of nicotine dependence: Secondary | ICD-10-CM | POA: Diagnosis not present

## 2018-03-27 DIAGNOSIS — G4733 Obstructive sleep apnea (adult) (pediatric): Secondary | ICD-10-CM | POA: Diagnosis not present

## 2018-03-27 DIAGNOSIS — E7800 Pure hypercholesterolemia, unspecified: Secondary | ICD-10-CM | POA: Diagnosis not present

## 2018-03-27 DIAGNOSIS — R1013 Epigastric pain: Secondary | ICD-10-CM | POA: Diagnosis not present

## 2018-03-27 DIAGNOSIS — Z5331 Laparoscopic surgical procedure converted to open procedure: Secondary | ICD-10-CM | POA: Diagnosis not present

## 2018-03-27 DIAGNOSIS — R7303 Prediabetes: Secondary | ICD-10-CM | POA: Diagnosis not present

## 2018-03-27 DIAGNOSIS — Z6841 Body Mass Index (BMI) 40.0 and over, adult: Secondary | ICD-10-CM | POA: Diagnosis not present

## 2018-03-27 DIAGNOSIS — I10 Essential (primary) hypertension: Secondary | ICD-10-CM | POA: Diagnosis not present

## 2018-03-27 DIAGNOSIS — K91870 Postprocedural hematoma of a digestive system organ or structure following a digestive system procedure: Secondary | ICD-10-CM | POA: Diagnosis not present

## 2018-03-27 DIAGNOSIS — Z9884 Bariatric surgery status: Secondary | ICD-10-CM | POA: Diagnosis not present

## 2018-03-27 DIAGNOSIS — K219 Gastro-esophageal reflux disease without esophagitis: Secondary | ICD-10-CM | POA: Diagnosis not present

## 2018-03-27 DIAGNOSIS — Z888 Allergy status to other drugs, medicaments and biological substances status: Secondary | ICD-10-CM | POA: Diagnosis not present

## 2018-03-27 DIAGNOSIS — K56609 Unspecified intestinal obstruction, unspecified as to partial versus complete obstruction: Secondary | ICD-10-CM | POA: Diagnosis not present

## 2018-03-27 DIAGNOSIS — K9589 Other complications of other bariatric procedure: Secondary | ICD-10-CM | POA: Diagnosis not present

## 2018-03-27 DIAGNOSIS — K228 Other specified diseases of esophagus: Secondary | ICD-10-CM | POA: Diagnosis not present

## 2018-03-28 DIAGNOSIS — Z9884 Bariatric surgery status: Secondary | ICD-10-CM | POA: Diagnosis not present

## 2018-03-28 DIAGNOSIS — K5669 Other partial intestinal obstruction: Secondary | ICD-10-CM | POA: Diagnosis not present

## 2018-04-16 DIAGNOSIS — R1032 Left lower quadrant pain: Secondary | ICD-10-CM | POA: Diagnosis not present

## 2018-04-16 DIAGNOSIS — Z9884 Bariatric surgery status: Secondary | ICD-10-CM | POA: Diagnosis not present

## 2018-04-16 DIAGNOSIS — K6389 Other specified diseases of intestine: Secondary | ICD-10-CM | POA: Diagnosis not present

## 2018-04-16 DIAGNOSIS — G4733 Obstructive sleep apnea (adult) (pediatric): Secondary | ICD-10-CM | POA: Diagnosis not present

## 2018-04-16 DIAGNOSIS — K8020 Calculus of gallbladder without cholecystitis without obstruction: Secondary | ICD-10-CM | POA: Diagnosis not present

## 2018-04-16 DIAGNOSIS — Z79899 Other long term (current) drug therapy: Secondary | ICD-10-CM | POA: Diagnosis not present

## 2018-04-16 DIAGNOSIS — R1030 Lower abdominal pain, unspecified: Secondary | ICD-10-CM | POA: Diagnosis not present

## 2018-04-16 DIAGNOSIS — Z87891 Personal history of nicotine dependence: Secondary | ICD-10-CM | POA: Diagnosis not present

## 2018-04-16 DIAGNOSIS — R590 Localized enlarged lymph nodes: Secondary | ICD-10-CM | POA: Diagnosis not present

## 2018-04-16 DIAGNOSIS — K529 Noninfective gastroenteritis and colitis, unspecified: Secondary | ICD-10-CM | POA: Diagnosis not present

## 2018-04-16 DIAGNOSIS — Z888 Allergy status to other drugs, medicaments and biological substances status: Secondary | ICD-10-CM | POA: Diagnosis not present

## 2018-04-17 DIAGNOSIS — R1030 Lower abdominal pain, unspecified: Secondary | ICD-10-CM | POA: Diagnosis not present

## 2018-04-17 DIAGNOSIS — R590 Localized enlarged lymph nodes: Secondary | ICD-10-CM | POA: Diagnosis not present

## 2018-04-17 DIAGNOSIS — K6389 Other specified diseases of intestine: Secondary | ICD-10-CM | POA: Diagnosis not present

## 2018-04-17 DIAGNOSIS — K8020 Calculus of gallbladder without cholecystitis without obstruction: Secondary | ICD-10-CM | POA: Diagnosis not present

## 2018-04-22 ENCOUNTER — Ambulatory Visit: Payer: BLUE CROSS/BLUE SHIELD | Admitting: Family Medicine

## 2018-04-22 DIAGNOSIS — I10 Essential (primary) hypertension: Secondary | ICD-10-CM | POA: Diagnosis not present

## 2018-04-22 DIAGNOSIS — R222 Localized swelling, mass and lump, trunk: Secondary | ICD-10-CM | POA: Diagnosis not present

## 2018-04-30 DIAGNOSIS — L02213 Cutaneous abscess of chest wall: Secondary | ICD-10-CM | POA: Diagnosis not present

## 2018-04-30 DIAGNOSIS — Z713 Dietary counseling and surveillance: Secondary | ICD-10-CM | POA: Diagnosis not present

## 2018-04-30 DIAGNOSIS — Z87891 Personal history of nicotine dependence: Secondary | ICD-10-CM | POA: Diagnosis not present

## 2018-04-30 DIAGNOSIS — L539 Erythematous condition, unspecified: Secondary | ICD-10-CM | POA: Diagnosis not present

## 2018-04-30 DIAGNOSIS — I10 Essential (primary) hypertension: Secondary | ICD-10-CM | POA: Diagnosis not present

## 2018-04-30 DIAGNOSIS — Z79899 Other long term (current) drug therapy: Secondary | ICD-10-CM | POA: Diagnosis not present

## 2018-04-30 DIAGNOSIS — Z9989 Dependence on other enabling machines and devices: Secondary | ICD-10-CM | POA: Diagnosis not present

## 2018-04-30 DIAGNOSIS — Z888 Allergy status to other drugs, medicaments and biological substances status: Secondary | ICD-10-CM | POA: Diagnosis not present

## 2018-04-30 DIAGNOSIS — Z9884 Bariatric surgery status: Secondary | ICD-10-CM | POA: Diagnosis not present

## 2018-04-30 DIAGNOSIS — G4733 Obstructive sleep apnea (adult) (pediatric): Secondary | ICD-10-CM | POA: Diagnosis not present

## 2018-04-30 DIAGNOSIS — E7800 Pure hypercholesterolemia, unspecified: Secondary | ICD-10-CM | POA: Diagnosis not present

## 2018-05-02 ENCOUNTER — Encounter: Payer: Self-pay | Admitting: Medical

## 2018-05-02 ENCOUNTER — Ambulatory Visit: Payer: BLUE CROSS/BLUE SHIELD | Admitting: Medical

## 2018-05-02 VITALS — BP 137/95 | HR 93 | Temp 97.9°F | Resp 16 | Ht 70.0 in | Wt 318.4 lb

## 2018-05-02 DIAGNOSIS — I10 Essential (primary) hypertension: Secondary | ICD-10-CM | POA: Diagnosis not present

## 2018-05-02 DIAGNOSIS — L0291 Cutaneous abscess, unspecified: Secondary | ICD-10-CM

## 2018-05-02 DIAGNOSIS — R599 Enlarged lymph nodes, unspecified: Secondary | ICD-10-CM | POA: Diagnosis not present

## 2018-05-02 DIAGNOSIS — Z87891 Personal history of nicotine dependence: Secondary | ICD-10-CM | POA: Diagnosis not present

## 2018-05-02 DIAGNOSIS — G4733 Obstructive sleep apnea (adult) (pediatric): Secondary | ICD-10-CM | POA: Diagnosis not present

## 2018-05-02 DIAGNOSIS — R161 Splenomegaly, not elsewhere classified: Secondary | ICD-10-CM | POA: Diagnosis not present

## 2018-05-02 DIAGNOSIS — R112 Nausea with vomiting, unspecified: Secondary | ICD-10-CM | POA: Diagnosis not present

## 2018-05-02 DIAGNOSIS — Z79899 Other long term (current) drug therapy: Secondary | ICD-10-CM | POA: Diagnosis not present

## 2018-05-02 DIAGNOSIS — Z888 Allergy status to other drugs, medicaments and biological substances status: Secondary | ICD-10-CM | POA: Diagnosis not present

## 2018-05-02 DIAGNOSIS — R109 Unspecified abdominal pain: Secondary | ICD-10-CM | POA: Diagnosis not present

## 2018-05-02 DIAGNOSIS — R1013 Epigastric pain: Secondary | ICD-10-CM | POA: Diagnosis not present

## 2018-05-02 NOTE — Progress Notes (Signed)
Subjective:    Patient ID: Roger Salinas, male    DOB: 16-Nov-1990, 27 y.o.   MRN: [id number]  HPI  Pt in for rt side upper pectoralis area abscess. The pt states I and D was performed 2 days ago. He states area is still painful but does overall feel better. No fever, no chills or sweats.   Pt states he was only on clindamycin since drainage. He was on keflex previously when abscess had started to form. Bariatric doctor had given this to him.  I reviewed ED note today.  Pt states area is still draining daily.     Review of Systems  Constitutional: Negative for chills, fatigue and fever.  Respiratory: Negative for cough, chest tightness, shortness of breath and wheezing.   Cardiovascular: Negative for chest pain and palpitations.  Gastrointestinal: Negative for abdominal pain.  Musculoskeletal: Negative for back pain.  Skin: Negative for rash.  Hematological: Negative for adenopathy. Does not bruise/bleed easily.  Psychiatric/Behavioral: Negative for behavioral problems and dysphoric mood. The patient is not nervous/anxious.     Past Medical History:  Diagnosis Date  . Essential hypertension 07/03/2017  . Obstructive sleep apnea 06/03/2017     Social History   Socioeconomic History  . Marital status: Married    Spouse name: Not on file  . Number of children: Not on file  . Years of education: Not on file  . Highest education level: Not on file  Occupational History  . Not on file  Social Needs  . Financial resource strain: Not on file  . Food insecurity:    Worry: Not on file    Inability: Not on file  . Transportation needs:    Medical: Not on file    Non-medical: Not on file  Tobacco Use  . Smoking status: Former Smoker    Packs/day: 1.00    Years: 12.00    Pack years: 12.00    Types: Cigarettes    Start date: 2006    Last attempt to quit: 04/15/2017    Years since quitting: 1.0  . Smokeless tobacco: Never Used  Substance and Sexual Activity  . Alcohol  use: No    Frequency: Never  . Drug use: No  . Sexual activity: Not on file  Lifestyle  . Physical activity:    Days per week: Not on file    Minutes per session: Not on file  . Stress: Not on file  Relationships  . Social connections:    Talks on phone: Not on file    Gets together: Not on file    Attends religious service: Not on file    Active member of club or organization: Not on file    Attends meetings of clubs or organizations: Not on file    Relationship status: Not on file  . Intimate partner violence:    Fear of current or ex partner: Not on file    Emotionally abused: Not on file    Physically abused: Not on file    Forced sexual activity: Not on file  Other Topics Concern  . Not on file  Social History Narrative  . Not on file    Past Surgical History:  Procedure Laterality Date  . TONSILLECTOMY      Family History  Problem Relation Age of Onset  . Hypertension Mother   . Diabetes Mother   . Diabetes Maternal Uncle   . Diabetes Maternal Grandmother   . Diabetes Maternal Uncle   . Heart attack  Neg Hx   . Hyperlipidemia Neg Hx   . Sudden death Neg Hx     Allergies  Allergen Reactions  . Lisinopril Cough    Current Outpatient Medications on File Prior to Visit  Medication Sig Dispense Refill  . cholecalciferol (VITAMIN D) 1000 units tablet Take 1,000 Units by mouth 2 (two) times daily.    Marland Kitchen. amLODipine (NORVASC) 5 MG tablet Take 1 tablet (5 mg total) by mouth daily. (Patient not taking: Reported on 05/02/2018) 90 tablet 2  . valsartan (DIOVAN) 320 MG tablet Take 1 tablet (320 mg total) by mouth daily. (Patient not taking: Reported on 05/02/2018) 90 tablet 2   No current facility-administered medications on file prior to visit.     BP (!) 137/95   Pulse 93   Temp 97.9 F (36.6 C) (Oral)   Resp 16   Ht 5\' 10"  (1.778 m)   Wt (!) 318 lb 6.4 oz (144.4 kg)   SpO2 97%   BMI 45.69 kg/m       Objective:   Physical Exam  General- No acute  distress. Pleasant patient. Neck- Full range of motion, no jvd Lungs- Clear, even and unlabored. Heart- regular rate and rhythm. Neurologic- CNII- XII grossly intact.  Skin- rt side chest/upper outer aspect 2 cm area of induration around I and D site. Not real warm to touch mild tender.       Assessment & Plan:  Your area of abscess looks like it is appropriately healing post  I and D.  By description of the size and comparison before I and D it appears to be draining and healing.  Some slight concern for the minimal red area with  slight induration left of the incision site.  I removed a little bit of gauze today and got a wound culture.  We will follow that culture and make sure that clindamycin would be adequate.  Make sure that you are getting probiotic over-the-counter or eating probiotic rich foods.  Follow-up on Tuesday for recheck of the area and probable removal of further gauze.  If you were to get worsening signs symptoms over the weekend then return back to the emergency department.  Esperanza RichtersEdward Rachael Ferrie, PA-C

## 2018-05-02 NOTE — Patient Instructions (Addendum)
Your area of abscess looks like it is appropriately healing post  I and D.  By description of the size and comparison before I and D  it appears to be draining and healing.  Some slight concern for the minimal red area with  slight induration left of the incision site.  I removed a little bit of gauze today and got a wound culture.  We will follow that culture and make sure that clindamycin would be adequate.  Make sure that you are getting probiotic over-the-counter or eating probiotic rich foods.  Follow-up on Tuesday for recheck of the area and probable removal of further gauze.  If you were to get worsening signs symptoms over the weekend then return back to the emergency department.

## 2018-05-05 LAB — WOUND CULTURE
MICRO NUMBER:: 91132201
RESULT:: NO GROWTH
SPECIMEN QUALITY:: ADEQUATE

## 2018-05-07 ENCOUNTER — Encounter: Payer: Self-pay | Admitting: Family Medicine

## 2018-05-07 ENCOUNTER — Ambulatory Visit: Payer: BLUE CROSS/BLUE SHIELD | Admitting: Family Medicine

## 2018-05-07 VITALS — BP 122/82 | HR 84 | Temp 98.4°F | Resp 16 | Wt 315.0 lb

## 2018-05-07 DIAGNOSIS — L0291 Cutaneous abscess, unspecified: Secondary | ICD-10-CM

## 2018-05-07 NOTE — Progress Notes (Signed)
CC: Skin complaint   Subjective: Patient is a 27 y.o. male here for f/u abscess.  Had I&D in addition to being placed on keflex. Area is on R breast. No current pain, some drainage. Needs packing removed. Denies fevers. Cx obtained 5 d ago and is neg (already was on abx).   ROS: Const: no fevers  Past Medical History:  Diagnosis Date  . Essential hypertension 07/03/2017  . Obstructive sleep apnea 06/03/2017    Objective: BP 122/82   Pulse 84   Temp 98.4 F (36.9 C) (Oral)   Resp 16   Wt (!) 315 lb (142.9 kg)   SpO2 97%   BMI 45.20 kg/m  General: Awake, appears stated age Skin: 1.5 cm opening with indurated tissue, packing in place, mild ttp, no fluctuance or erythema, no excessive warmth Lungs: No accessory muscle use Psych: Age appropriate judgment and insight, normal affect and mood  Assessment and Plan: Abscess  Finish 3 d of abx. Does not appear infected currently, will not alter regimen. Will pack with expectation to remove in 5 d. Warning signs and symptoms verbalized and written down in AVS.  F/u for CPE at earliest convenience. The patient voiced understanding and agreement to the plan.  Roger Salinas [place], DO 05/07/18  9:46 AM

## 2018-05-07 NOTE — Patient Instructions (Signed)
Keep area clean area clean and dry.  Things to look out for: increasing pain not relieved by ibuprofen/acetaminophen, fevers, spreading redness, drainage of pus, or foul odor.  Pull packing on Monday.  Take antibiotic for 3 more days.  Let us know if you need anything.

## 2018-05-09 ENCOUNTER — Inpatient Hospital Stay: Payer: BLUE CROSS/BLUE SHIELD | Admitting: Family

## 2018-06-24 DIAGNOSIS — L739 Follicular disorder, unspecified: Secondary | ICD-10-CM | POA: Diagnosis not present

## 2018-06-24 DIAGNOSIS — I10 Essential (primary) hypertension: Secondary | ICD-10-CM | POA: Diagnosis not present

## 2018-06-25 DIAGNOSIS — Z9884 Bariatric surgery status: Secondary | ICD-10-CM | POA: Diagnosis not present

## 2018-06-25 DIAGNOSIS — Z6841 Body Mass Index (BMI) 40.0 and over, adult: Secondary | ICD-10-CM | POA: Diagnosis not present

## 2018-06-25 DIAGNOSIS — Z713 Dietary counseling and surveillance: Secondary | ICD-10-CM | POA: Diagnosis not present

## 2018-07-02 ENCOUNTER — Encounter: Payer: Self-pay | Admitting: Medical

## 2018-07-02 ENCOUNTER — Ambulatory Visit: Payer: BLUE CROSS/BLUE SHIELD | Admitting: Medical

## 2018-07-02 VITALS — BP 141/94 | HR 71 | Temp 97.5°F | Resp 16 | Ht 70.0 in | Wt 284.4 lb

## 2018-07-02 DIAGNOSIS — L0390 Cellulitis, unspecified: Secondary | ICD-10-CM | POA: Diagnosis not present

## 2018-07-02 MED ORDER — CEFTRIAXONE SODIUM 1 G IJ SOLR
1.0000 g | Freq: Once | INTRAMUSCULAR | Status: AC
  Administered 2018-07-02: 1 g via INTRAMUSCULAR

## 2018-07-02 MED ORDER — SULFAMETHOXAZOLE-TRIMETHOPRIM 800-160 MG PO TABS: 1.0000 | ORAL_TABLET | Freq: Two times a day (BID) | ORAL | 0 refills | Status: DC

## 2018-07-02 NOTE — Progress Notes (Signed)
Subjective:    Patient ID: Roger Salinas, male    DOB: 12/30/1990, 27 y.o.   MRN: [id number]  HPI  Rt upper quadrant slight tender and pink last week. clor would get brighter red on and off. Pt saw his bariatric MD. who wrote for keflex last week. Despite this he states area got redder and indurated. Pt has taken keflex 500 mg tid for one week. Monday are got red and indurated. Now is tender. No fever, no chills or sweats.     Review of Systems  Constitutional: Negative for chills, fatigue and fever.  Cardiovascular: Negative for chest pain and palpitations.  Gastrointestinal: Negative for abdominal pain, constipation, diarrhea and vomiting.  Skin: Positive for rash.       See hpi. And physical exam.  Hematological: Negative for adenopathy. Does not bruise/bleed easily.    Past Medical History:  Diagnosis Date  . Essential hypertension 07/03/2017  . Obstructive sleep apnea 06/03/2017     Social History   Socioeconomic History  . Marital status: Married    Spouse name: Not on file  . Number of children: Not on file  . Years of education: Not on file  . Highest education level: Not on file  Occupational History  . Not on file  Social Needs  . Financial resource strain: Not on file  . Food insecurity:    Worry: Not on file    Inability: Not on file  . Transportation needs:    Medical: Not on file    Non-medical: Not on file  Tobacco Use  . Smoking status: Former Smoker    Packs/day: 1.00    Years: 12.00    Pack years: 12.00    Types: Cigarettes    Start date: 2006    Last attempt to quit: 04/15/2017    Years since quitting: 1.2  . Smokeless tobacco: Never Used  Substance and Sexual Activity  . Alcohol use: No    Frequency: Never  . Drug use: No  . Sexual activity: Not on file  Lifestyle  . Physical activity:    Days per week: Not on file    Minutes per session: Not on file  . Stress: Not on file  Relationships  . Social connections:    Talks on phone:  Not on file    Gets together: Not on file    Attends religious service: Not on file    Active member of club or organization: Not on file    Attends meetings of clubs or organizations: Not on file    Relationship status: Not on file  . Intimate partner violence:    Fear of current or ex partner: Not on file    Emotionally abused: Not on file    Physically abused: Not on file    Forced sexual activity: Not on file  Other Topics Concern  . Not on file  Social History Narrative  . Not on file    Past Surgical History:  Procedure Laterality Date  . TONSILLECTOMY      Family History  Problem Relation Age of Onset  . Hypertension Mother   . Diabetes Mother   . Diabetes Maternal Uncle   . Diabetes Maternal Grandmother   . Diabetes Maternal Uncle   . Heart attack Neg Hx   . Hyperlipidemia Neg Hx   . Sudden death Neg Hx     Allergies  Allergen Reactions  . Lisinopril Cough    Current Outpatient Medications on File Prior to  Visit  Medication Sig Dispense Refill  . Calcium Citrate-Vitamin D 250-200 MG-UNIT TABS Take by mouth.    . cephALEXin (KEFLEX) 500 MG capsule Take by mouth.    . cholecalciferol (VITAMIN D) 1000 units tablet Take 1,000 Units by mouth 2 (two) times daily.    Marland Kitchen. omeprazole (PRILOSEC) 40 MG capsule TAKE ONE CAPSULE (40 MG DOSE) BY MOUTH DAILY.  6  . ursodiol (ACTIGALL) 300 MG capsule TAKE ONE TAB TWICE A DAY WITH FOOD BEGINNING 14 DAYS AFTER SURGERY  6   No current facility-administered medications on file prior to visit.     BP (!) 141/94   Pulse 71   Temp (!) 97.5 F (36.4 C) (Oral)   Resp 16   Ht 5\' 10"  (1.778 m)   Wt 284 lb 6.4 oz (129 kg)   SpO2 99%   BMI 40.81 kg/m        Objective:   Physical Exam   General- No acute distress. Pleasant patient. Neck- Full range of motion, no jvd Lungs- Clear, even and unlabored. Heart- regular rate and rhythm. Neurologic- CNII- XII grossly intact.  Skin- ruq region  6 cm x 3 cm approximate red,  warm, tender area. Indurated, no fluctuance. No dc. Not come to head.     Assessment & Plan:  You do appear to have early cellulitis. Area has not come to head. Appears you need stronger antibiotic.   We gave you rocephin 1 gram im today Rx bactrim ds to your pharmacy. Start today.   If area expands or worsens as discussed then ED. NO abscess presently but may change. Apply warm compresses twice daily.  Follow up Friday morning or as needed.  If better by tomorrow afternoon 4 pm can cancel. If after 7-10 not back to normal need to recheck.  Esperanza RichtersEdward Kareema Keitt, PA-C

## 2018-07-02 NOTE — Patient Instructions (Addendum)
You do appear to have early cellulitis. Area has not come to head. Appears you need stronger antibiotic.   We gave you rocephin 1 gram im today Rx bactrim ds to your pharmacy. Start today.   If area expands or worsens as discussed then ED. NO abscess presently but may change. Apply warm compresses twice daily.  Follow up Friday morning or as needed.  If better by tomorrow afternoon 4 pm can cancel. If after 7-10 not back to normal need to recheck.

## 2018-07-04 ENCOUNTER — Ambulatory Visit: Payer: BLUE CROSS/BLUE SHIELD | Admitting: Family Medicine

## 2018-07-08 ENCOUNTER — Encounter: Payer: Self-pay | Admitting: Family Medicine

## 2018-07-08 ENCOUNTER — Ambulatory Visit: Payer: BLUE CROSS/BLUE SHIELD | Admitting: Family Medicine

## 2018-07-08 VITALS — BP 132/80 | HR 107 | Temp 98.3°F | Ht 70.0 in | Wt 283.0 lb

## 2018-07-08 DIAGNOSIS — L02211 Cutaneous abscess of abdominal wall: Secondary | ICD-10-CM

## 2018-07-08 NOTE — Progress Notes (Signed)
Chief Complaint  Patient presents with  . Follow-up    Roger Salinas is a 27 y.o. male here for a skin complaint.  Duration: 2 weeks Location: RUQ abd Pruritic? No Painful? Yes Drainage? No New soaps/lotions/topicals/detergents? No Other associated symptoms: TTP Therapies tried thus far: Keflex, Rocephin 1 g x 1, Bactrim  ROS:  Const: No fevers Skin: As noted in HPI  Past Medical History:  Diagnosis Date  . Essential hypertension 07/03/2017  . Obstructive sleep apnea 06/03/2017    BP 132/80 (BP Location: Left Arm, Patient Position: Sitting, Cuff Size: Large)   Pulse (!) 107   Temp 98.3 F (36.8 C) (Oral)   Ht 5\' 10"  (1.778 m)   Wt 283 lb (128.4 kg)   SpO2 97%   BMI 40.61 kg/m   Gen: awake, alert, appearing stated age Lungs: No accessory muscle use Skin: See below. Mild warmth. +erythema, TTP, fluctuance Psych: Age appropriate judgment and insight   R upper abd  Procedure note; incision and drainage Informed consent obtained. The area was cleaned with Hibiclens. The area was anesthetized with 6 mL of 1% lidocaine with epinephrine. Once adequate anesthesia was obtained, a cruciate incision was made with 11 blade scalpel. Approximately 30 mL of purulent material with blood was expressed. Cultures were taken. Loculations were interrupted with a curved hemostat. The area was packed with approximately 15 cm of 0.25 in iodoform gauze. The area was then dressed with gauze. There were no complications noted. The patient tolerated the procedure well.   Abscess of abdominal wall - Plan: PR DRAIN SKIN ABSCESS SIMPLE  Cont Bactrim. Warning signs and symptoms verbalized and written down in AVS.  F/u in 1 week or he can pull packing on his own.  The patient voiced understanding and agreement to the plan.  [name] ShepherdWendling, DO 07/08/18 4:42 PM

## 2018-07-08 NOTE — Patient Instructions (Signed)
Finish the Bactrim for now.  Continue warm compresses.  Do not shower for the rest of the day. When you do wash it, use only soap and water. Do not vigorously scrub.  Keep the area clean and dry.   Things to look out for: increasing pain not relieved by ibuprofen/acetaminophen, fevers, spreading redness, increased drainage of pus, or a new foul odor.  Let us know if you need anything.

## 2018-07-08 NOTE — Progress Notes (Signed)
Pre visit review using our clinic review tool, if applicable. No additional management support is needed unless otherwise documented below in the visit note. 

## 2018-07-16 ENCOUNTER — Ambulatory Visit: Payer: BLUE CROSS/BLUE SHIELD | Admitting: Family Medicine

## 2018-07-16 ENCOUNTER — Encounter: Payer: Self-pay | Admitting: Family Medicine

## 2018-07-16 VITALS — BP 128/86 | HR 67 | Temp 97.6°F | Ht 70.0 in | Wt 279.1 lb

## 2018-07-16 DIAGNOSIS — Z5189 Encounter for other specified aftercare: Secondary | ICD-10-CM

## 2018-07-16 NOTE — Patient Instructions (Signed)
Things look good.   Let me know if anything changes.  Hold off on doing anything for cyst above.  Removing the cyst is considered "elective" and likely insurance will not cover.  You should be refunded you copay.   Let us know if you need anything.

## 2018-07-16 NOTE — Progress Notes (Signed)
Pre visit review using our clinic review tool, if applicable. No additional management support is needed unless otherwise documented below in the visit note. 

## 2018-07-16 NOTE — Progress Notes (Signed)
CC- f/u Patient had incision and drainage performed last week.  It was successful.  He finished antibiotic.  He pulled packing yesterday.  Still having some drainage, no pain, fevers, redness, foul odor.  Exam-small opening present, serosanguineous fluid expressed, no erythema, excessive warmth, fluctuance, tenderness to palpation  Abscess re-check  Appears to be healing well. Warning signs and symptoms verbalized and written down in AVS.  F/u as originally scheduled. Patient voiced understanding and agreement to the plan.  [name] Wendling 10:05 AM 07/16/18

## 2018-07-30 ENCOUNTER — Ambulatory Visit (INDEPENDENT_AMBULATORY_CARE_PROVIDER_SITE_OTHER): Payer: BLUE CROSS/BLUE SHIELD | Admitting: Family Medicine

## 2018-07-30 ENCOUNTER — Encounter: Payer: Self-pay | Admitting: Family Medicine

## 2018-07-30 VITALS — BP 132/88 | HR 75 | Temp 97.9°F | Ht 70.0 in | Wt 272.4 lb

## 2018-07-30 DIAGNOSIS — L0291 Cutaneous abscess, unspecified: Secondary | ICD-10-CM

## 2018-07-30 MED ORDER — DOXYCYCLINE HYCLATE 100 MG PO TABS: 100.0000 mg | ORAL_TABLET | Freq: Two times a day (BID) | ORAL | 0 refills | Status: DC

## 2018-07-30 NOTE — Progress Notes (Signed)
Pre visit review using our clinic review tool, if applicable. No additional management support is needed unless otherwise documented below in the visit note. 

## 2018-07-30 NOTE — Progress Notes (Signed)
Chief Complaint  Patient presents with  . Cyst    Roger Salinas is a 27 y.o. male here for a skin complaint.  Duration: 3 days Location: abd Pruritic? No Painful? Yes Drainage? No Other associated symptoms: redness Therapies tried thus far: none +hx of abscesses, I&D typically only thing that is helpful  ROS:  Const: No fevers Skin: As noted in HPI  Past Medical History:  Diagnosis Date  . Essential hypertension 07/03/2017  . Obstructive sleep apnea 06/03/2017    BP 132/88 (BP Location: Left Arm, Patient Position: Sitting, Cuff Size: Normal)   Pulse 75   Temp 97.9 F (36.6 C) (Oral)   Ht 5\' 10"  (1.778 m)   Wt 272 lb 6 oz (123.5 kg)   SpO2 97%   BMI 39.08 kg/m  Gen: awake, alert, appearing stated age Lungs: No accessory muscle use Skin: Erythematous and fluctuance lesion on anterior abd at approx T 6 dermatome just R of midline; no drainage, mild warmth. Dimensions are approx 3 cm x 7 cm.  Psych: Age appropriate judgment and insight  Procedure note; incision and drainage Informed consent obtained. The area was cleaned with Hibiclens. The area was anesthetized with 5.5 mL of 1% lidocaine with epinephrine. Once adequate anesthesia was obtained, a cruciate incision was made with 11 blade scalpel. Approximately 11 mL of purulent material with blood was expressed. Cultures were taken. Loculations were interrupted with a curved hemostat. The area was packed with approximately 20 cm of 0.25 in iodoform gauze. The area was then dressed with gauze. There were no complications noted. The patient tolerated the procedure well.  Abscess - Plan: doxycycline (VIBRA-TABS) 100 MG tablet, PR DRAIN SKIN ABSCESS SIMPLE  Orders as above. Aftercare instructions provided in addition to warning signs. Bleach bath info given for his recurrent skin infections. He pulls his own wick, told to do this in 1 week.  F/u prn. The patient voiced understanding and agreement to the  plan.  [name] CovelWendling, DO 07/30/18 3:58 PM

## 2018-07-30 NOTE — Patient Instructions (Signed)
To prepare a bleach bath, one-fourth to one-half cup of bleach is placed in a full bathtub (about 40 gallons) of water. Bleach baths are usually taken for 5 to 10 minutes twice per week and should be followed by application of an emollient.  Do not shower for the rest of the day. When you do wash it, use only soap and water. Do not vigorously scrub. Keep the area clean and dry. Pull wick in 1 week.  Things to look out for: increasing pain not relieved by ibuprofen/acetaminophen, fevers, spreading redness, drainage of pus, or foul odor.  Ice/cold pack over area for 10-15 min twice daily.  OK to take Tylenol 1000 mg (2 extra strength tabs) or 975 mg (3 regular strength tabs) every 6 hours as needed.  Let us know if you need anything.

## 2018-08-27 ENCOUNTER — Ambulatory Visit (INDEPENDENT_AMBULATORY_CARE_PROVIDER_SITE_OTHER): Payer: BLUE CROSS/BLUE SHIELD | Admitting: Family Medicine

## 2018-08-27 ENCOUNTER — Encounter: Payer: Self-pay | Admitting: Family Medicine

## 2018-08-27 VITALS — BP 136/86 | HR 69 | Temp 97.8°F | Ht 70.0 in | Wt 261.4 lb

## 2018-08-27 DIAGNOSIS — L089 Local infection of the skin and subcutaneous tissue, unspecified: Secondary | ICD-10-CM

## 2018-08-27 DIAGNOSIS — L0291 Cutaneous abscess, unspecified: Secondary | ICD-10-CM

## 2018-08-27 MED ORDER — CLINDAMYCIN HCL 300 MG PO CAPS: 300.0000 mg | ORAL_CAPSULE | Freq: Three times a day (TID) | ORAL | 0 refills | Status: AC

## 2018-08-27 NOTE — Progress Notes (Signed)
Chief Complaint  Patient presents with  . Mass    abdominal    Roger Salinas is a 28 y.o. male here for a skin complaint.  Duration: 2 weeks Location: abd Pruritic? No Painful? Yes Drainage? No New soaps/lotions/topicals/detergents? No Other associated symptoms: none Therapies tried thus far: Warm compresses Happened before x2 over past several mo. Last time, presented similarly but w less pain.   ROS:  Const: No fevers Skin: As noted in HPI  Past Medical History:  Diagnosis Date  . Essential hypertension 07/03/2017  . Obstructive sleep apnea 06/03/2017    BP 136/86 (BP Location: Left Arm, Patient Position: Sitting, Cuff Size: Large)   Pulse 69   Temp 97.8 F (36.6 C) (Oral)   Ht 5\' 10"  (1.778 m)   Wt 261 lb 6 oz (118.6 kg)   SpO2 98%   BMI 37.50 kg/m  Gen: awake, alert, appearing stated age Lungs: No accessory muscle use Skin: See below. Hot to touch, indurated, +TTP. No drainage, fluctuance, excoriation Psych: Age appropriate judgment and insight   L abd wall  Procedure note; incision and drainage Informed consent obtained. The area was cleaned with alcohol. The area was anesthetized with 5.5 mL of 1% lidocaine with epinephrine. Once adequate anesthesia was obtained, a cruciate incision was made with 11 blade scalpel. Approximately 0.5 mL of purulent material with blood was expressed.  This was not ideal, though it would provide a route of escape for purulent material should it occur.  The area was then dressed with gauze. There were no complications noted. The patient tolerated the procedure well.    Abscess - Plan: clindamycin (CLEOCIN) 300 MG capsule  Recurrent infection of skin - Plan: clindamycin (CLEOCIN) 300 MG capsule, Ambulatory referral to Dermatology  [place] for abx. Refer to derm for further eval of recurrent infxn. I&D not satisfying, but pt wanted to go through with it given his hx.  F/u prn. The patient voiced understanding and  agreement to the plan.  [name] [place], DO 08/27/18 11:04 AM

## 2018-08-27 NOTE — Progress Notes (Signed)
Pre visit review using our clinic review tool, if applicable. No additional management support is needed unless otherwise documented below in the visit note. 

## 2018-08-27 NOTE — Patient Instructions (Signed)
If you do not hear anything about your referral in the next 1-2 weeks, call our office and ask for an update.  Do not shower for the rest of the day. When you do wash it, use only soap and water. Do not vigorously scrub. Keep the area clean and dry.   Things to look out for: increasing pain not relieved by ibuprofen/acetaminophen, fevers, spreading redness, drainage of pus, or foul odor.  Let us know if you need anything.

## 2018-09-03 ENCOUNTER — Encounter: Payer: Self-pay | Admitting: Family Medicine

## 2018-09-03 ENCOUNTER — Ambulatory Visit (INDEPENDENT_AMBULATORY_CARE_PROVIDER_SITE_OTHER): Payer: BLUE CROSS/BLUE SHIELD | Admitting: Family Medicine

## 2018-09-03 VITALS — BP 122/86 | HR 89 | Temp 97.6°F | Ht 70.0 in | Wt 257.1 lb

## 2018-09-03 DIAGNOSIS — L0291 Cutaneous abscess, unspecified: Secondary | ICD-10-CM

## 2018-09-03 NOTE — Progress Notes (Signed)
Pre visit review using our clinic review tool, if applicable. No additional management support is needed unless otherwise documented below in the visit note. 

## 2018-09-03 NOTE — Patient Instructions (Signed)
Pull packing after 1 week.   Things to look out for: increasing pain not relieved by ibuprofen/acetaminophen, fevers, spreading redness.

## 2018-09-03 NOTE — Progress Notes (Addendum)
Chief Complaint  Patient presents with  . Cyst    Roger Salinas is a 28 y.o. male here for a skin complaint.  Duration: 2 weeks Location: Abd Pruritic? No Painful? Yes Drainage? No Clindamycin.   ROS:  Const: No fevers Skin: As noted in HPI  Past Medical History:  Diagnosis Date  . Essential hypertension 07/03/2017  . Obstructive sleep apnea 06/03/2017    BP 122/86 (BP Location: Left Arm, Patient Position: Sitting, Cuff Size: Large)   Pulse 89   Temp 97.6 F (36.4 C) (Oral)   Ht 5\' 10"  (1.778 m)   Wt 257 lb 2 oz (116.6 kg)   SpO2 99%   BMI 36.89 kg/m  Gen: awake, alert, appearing stated age Lungs: No accessory muscle use Skin: There is an erythematous and fluctuant area over the left lateral portion of the abdomen at approximately the T2 dermatome.  It is tender, warm; no active drainage Psych: Age appropriate judgment and insight  Procedure note; incision and drainage Informed consent obtained. The area was cleaned with Hibiclens. The area was anesthetized with 5.5 mL of 1% lidocaine with epinephrine. Once adequate anesthesia was obtained, a cruciate incision was made with 11 blade scalpel. Approximately 15 mL of purulent material with blood was expressed. Cultures were taken. Loculations were interrupted with a curved hemostat. The area was packed with approximately 6 cm of 0.25 in iodoform gauze. The area was then dressed with gauze. There were no complications noted. The patient tolerated the procedure well.   Abscess - Plan: Wound culture, PR DRAIN SKIN ABSCESS SIMPLE  Will culture, treatment based on results. This is the same location as previously seen 1 week ago.  Unfortunately, the yield was very poor from that incision and drainage and antibiotics were ineffective at preventing the above issues today. Dermatology info provided. Aftercare instructions verbalized and written down.  This is his third abscess that was drained in the last several month  so he is well versed on what to do.  He plans on pulling the packing on his own but knows he can come here if he would like it done by [place]. F/u prn. The patient voiced understanding and agreement to the plan.  [name] [place], DO 09/03/18 9:59 AM

## 2018-09-05 LAB — WOUND CULTURE
MICRO NUMBER:: 88910
RESULT:: NO GROWTH
SPECIMEN QUALITY:: ADEQUATE

## 2018-09-10 DIAGNOSIS — E559 Vitamin D deficiency, unspecified: Secondary | ICD-10-CM | POA: Diagnosis not present

## 2018-09-10 DIAGNOSIS — Z6841 Body Mass Index (BMI) 40.0 and over, adult: Secondary | ICD-10-CM | POA: Diagnosis not present

## 2018-09-10 DIAGNOSIS — R7303 Prediabetes: Secondary | ICD-10-CM | POA: Diagnosis not present

## 2018-09-17 DIAGNOSIS — L728 Other follicular cysts of the skin and subcutaneous tissue: Secondary | ICD-10-CM | POA: Diagnosis not present

## 2018-09-25 DIAGNOSIS — Z9884 Bariatric surgery status: Secondary | ICD-10-CM | POA: Diagnosis not present

## 2018-09-25 DIAGNOSIS — I10 Essential (primary) hypertension: Secondary | ICD-10-CM | POA: Diagnosis not present

## 2018-09-25 DIAGNOSIS — E7800 Pure hypercholesterolemia, unspecified: Secondary | ICD-10-CM | POA: Diagnosis not present

## 2018-09-25 DIAGNOSIS — K912 Postsurgical malabsorption, not elsewhere classified: Secondary | ICD-10-CM | POA: Diagnosis not present

## 2018-11-05 ENCOUNTER — Encounter: Payer: BLUE CROSS/BLUE SHIELD | Admitting: Family Medicine

## 2018-11-14 ENCOUNTER — Encounter: Payer: Self-pay | Admitting: Family Medicine

## 2018-12-16 ENCOUNTER — Other Ambulatory Visit: Payer: Self-pay

## 2018-12-16 ENCOUNTER — Ambulatory Visit (INDEPENDENT_AMBULATORY_CARE_PROVIDER_SITE_OTHER): Payer: BLUE CROSS/BLUE SHIELD | Admitting: Family Medicine

## 2018-12-16 ENCOUNTER — Encounter: Payer: Self-pay | Admitting: Family Medicine

## 2018-12-16 DIAGNOSIS — L0291 Cutaneous abscess, unspecified: Secondary | ICD-10-CM | POA: Diagnosis not present

## 2018-12-16 NOTE — Progress Notes (Signed)
Chief Complaint  Patient presents with  . Follow-up    lump on left side    Roger Salinas is a 28 y.o. male here for a skin complaint. Due to COVID-19 pandemic, we are interacting via web portal for an electronic face-to-face visit. I verified patient's ID using 2 identifiers. Patient agreed to proceed with visit via this method. Patient is at home, I am at home. Patient and I are present for visit.    Duration: 1 month Location: abd Pruritic? No Painful? Yes Drainage? No New soaps/lotions/topicals/detergents? No Sick contacts? No Other associated symptoms: started getting painful and red 2-3 days ago Therapies tried thus far: none  ROS:  Const: No fevers Skin: As noted in HPI  Past Medical History:  Diagnosis Date  . Essential hypertension 07/03/2017  . Obstructive sleep apnea 06/03/2017   Exam No conversational dyspnea Age appropriate judgment and insight Nml affect and mood Elliptical area of erythema over ant abd  Abscess  Return tomorrow for I&D. Offered PO abx, declined given poor efficacy of this in past.  The patient voiced understanding and agreement to the plan.  [name], DO 12/16/18 1:27 PM

## 2018-12-17 ENCOUNTER — Ambulatory Visit (INDEPENDENT_AMBULATORY_CARE_PROVIDER_SITE_OTHER): Payer: BLUE CROSS/BLUE SHIELD | Admitting: Family Medicine

## 2018-12-17 ENCOUNTER — Ambulatory Visit: Payer: BLUE CROSS/BLUE SHIELD | Admitting: Family Medicine

## 2018-12-17 ENCOUNTER — Encounter: Payer: Self-pay | Admitting: Family Medicine

## 2018-12-17 ENCOUNTER — Telehealth: Payer: Self-pay | Admitting: Family Medicine

## 2018-12-17 VITALS — Wt 244.0 lb

## 2018-12-17 DIAGNOSIS — L0291 Cutaneous abscess, unspecified: Secondary | ICD-10-CM | POA: Diagnosis not present

## 2018-12-17 NOTE — Progress Notes (Signed)
Chief Complaint  Patient presents with  . Procedure   Here for I&D from yesterday's convo. Another area is also present that he wants drained as well.   Obj Alert and oriented Age app judgment and insight Skin unchanged from yesterday for initial lesion, there is a smaller area of both fluctuance and induration just distal   Procedure note; incision and drainage Informed consent obtained. The area was cleaned with alcohol. The area was anesthetized with 4 mL of 1% lidocaine with epinephrine. Once adequate anesthesia was obtained, a cruciate incision was made with 11 blade scalpel. Approximately 8 mL of purulent material with blood was expressed. Cultures were taken. Loculations were interrupted with a curved hemostat. The area was packed with approximately 11 cm of 0.25 in iodoform gauze. The area was then dressed with gauze. This process was repeated with 2 cc of 1% lido w epi on a separate area just below the initial lesion.  There were no complications noted. The patient tolerated the procedure well.  Abscess - Plan: PR DRAIN SKIN ABSCESS SIMPLE  Pull packing in 1 week. Aftercare instructions verbalized and written down. He is no stranger to this. Derm had no further recs. GS noted he is just more prone to them. He has failed bleach baths. Could try Hibiclens? Will see how he does w/o abx.  F/u prn otherwise. Pt voiced understanding and agreement to the plan.  Roger Salinas Adel Neyer 11:49 AM 12/17/18

## 2018-12-17 NOTE — Telephone Encounter (Signed)
That is correct 

## 2018-12-17 NOTE — Telephone Encounter (Signed)
Spoke with the pt to schedule 1 wk follow up appt. Pt states he informed Dr. Carmelia Roller that he could do the pull packing himself, states Dr. Carmelia Roller stating he could so pt did not need follow up appt

## 2018-12-17 NOTE — Patient Instructions (Signed)
Pull packing in 1 week.  Let me know if there is still a lot of pain and redness by the end of the week.  Do not shower for the rest of the day. When you do wash it, use only soap and water. Do not vigorously scrub. Keep the area clean and dry.   Things to look out for: increasing pain not relieved by ibuprofen/acetaminophen, fevers, spreading redness, a lot of drainage of pus, or worsening foul odor.  Let us know if you need anything.

## 2019-03-04 DIAGNOSIS — Z9884 Bariatric surgery status: Secondary | ICD-10-CM | POA: Diagnosis not present

## 2019-03-04 DIAGNOSIS — K912 Postsurgical malabsorption, not elsewhere classified: Secondary | ICD-10-CM | POA: Diagnosis not present

## 2019-04-01 DIAGNOSIS — I10 Essential (primary) hypertension: Secondary | ICD-10-CM | POA: Diagnosis not present

## 2019-04-01 DIAGNOSIS — Z9884 Bariatric surgery status: Secondary | ICD-10-CM | POA: Diagnosis not present

## 2019-04-01 DIAGNOSIS — E7800 Pure hypercholesterolemia, unspecified: Secondary | ICD-10-CM | POA: Diagnosis not present

## 2019-04-24 IMAGING — CT CT HEAD W/O CM
3 series · 14 of 47 positions shown, 16 images · non-contrast
Comparison: None.

CLINICAL DATA: Patient with headache. Sharp pain to the back of the
head. Dizziness.

EXAM:
CT HEAD WITHOUT CONTRAST
TECHNIQUE: Contiguous axial images were obtained from the base of the skull
through the vertex without intravenous contrast.

[Series 2: head wo · axial · 0.46mm/px · z∈[+124,+249]mm · 8 of 30 slices shown, 10 images]
[im 3/30  brain]
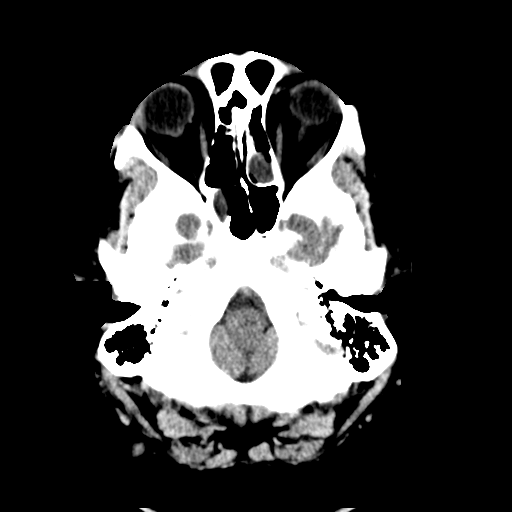
[im 3/30  bone]
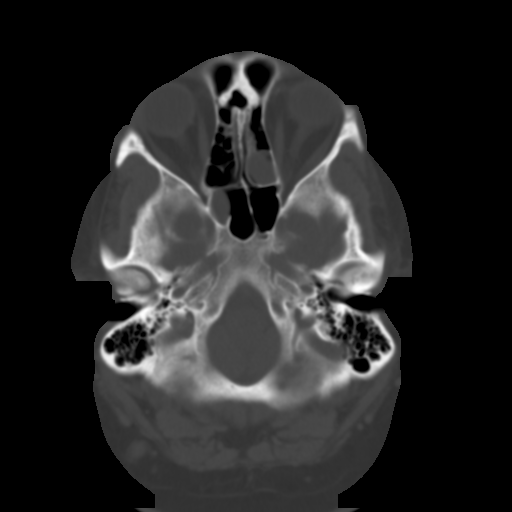
[im 7/30  brain]
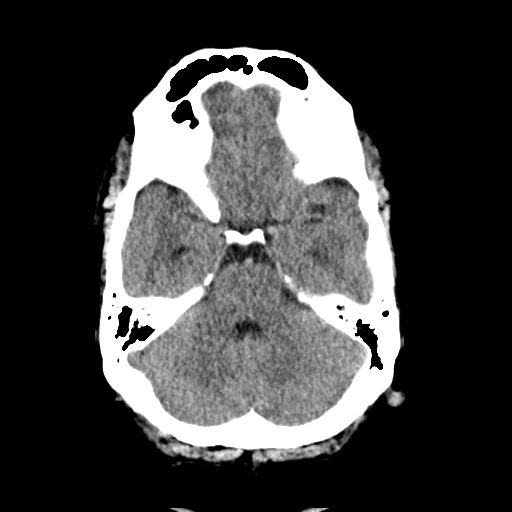
[im 10/30  brain]
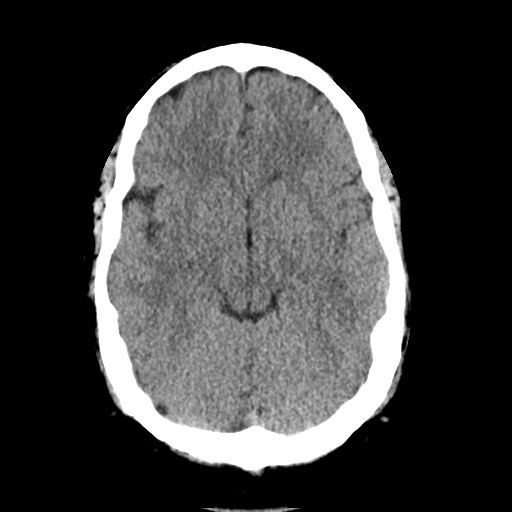
[im 14/30  brain]
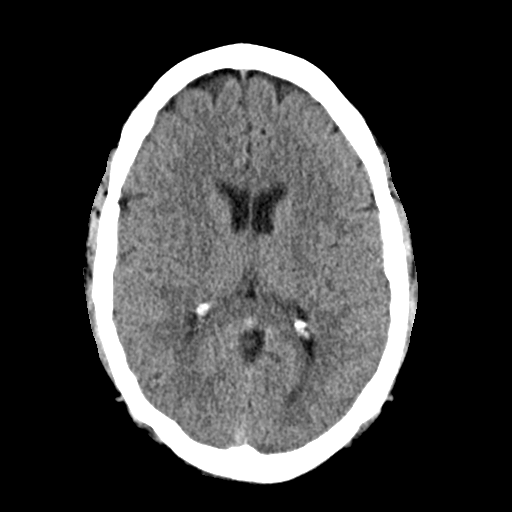
[im 17/30  brain]
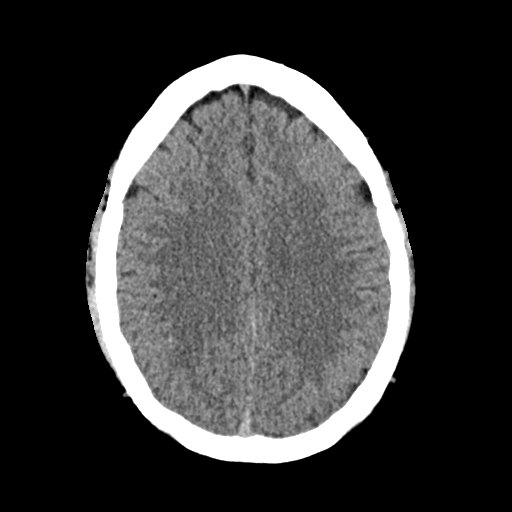
[im 17/30  bone]
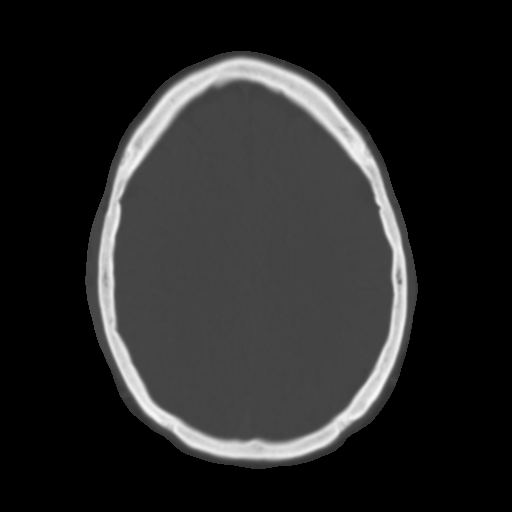
[im 21/30  brain]
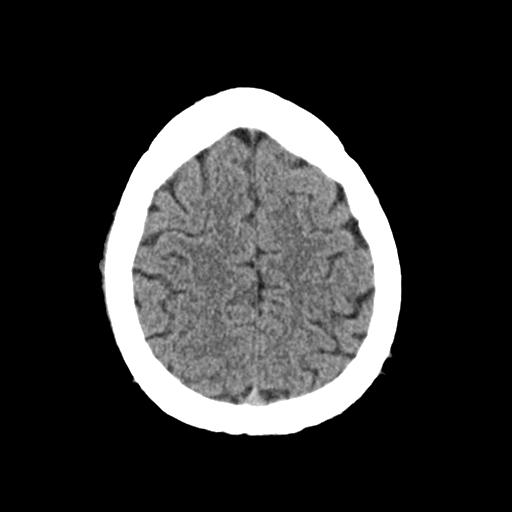
[im 24/30  brain]
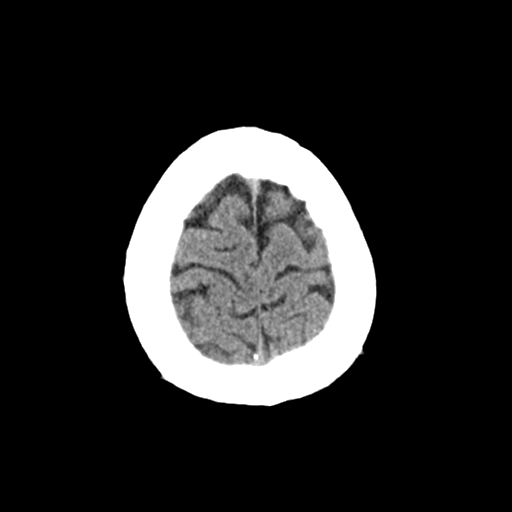
[im 28/30  brain]
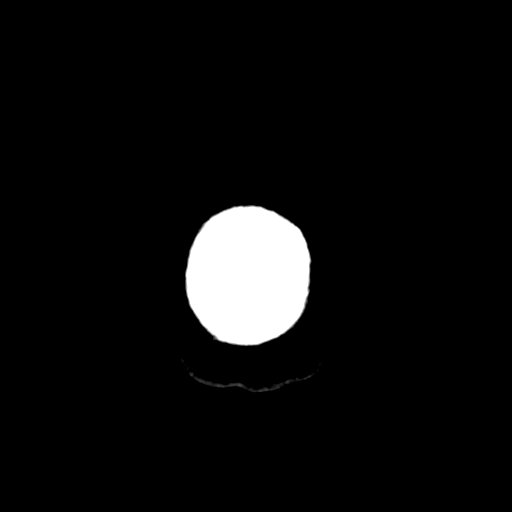

[Series 4: coronal soft · coronal · 0.30mm/px · 3 of 73 slices shown]
[im 25/73  brain]
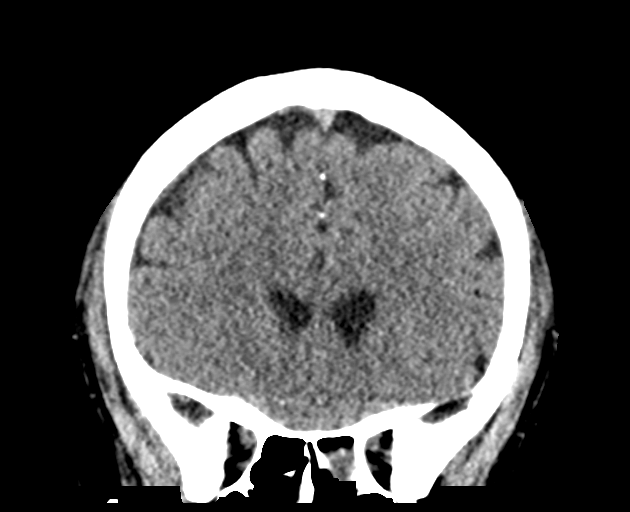
[im 33/73  brain]
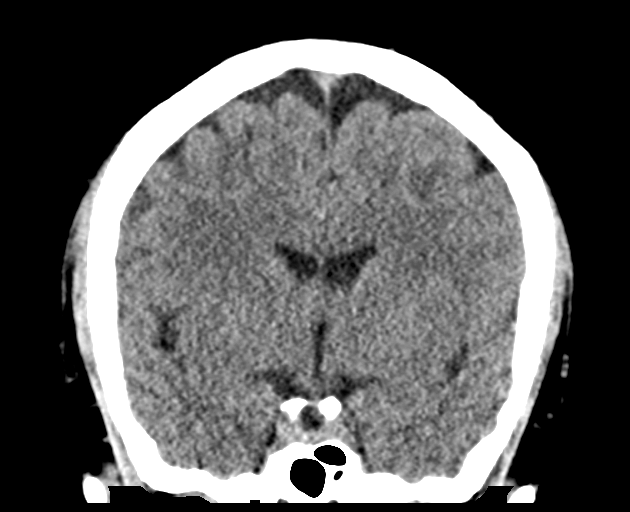
[im 41/73  brain]
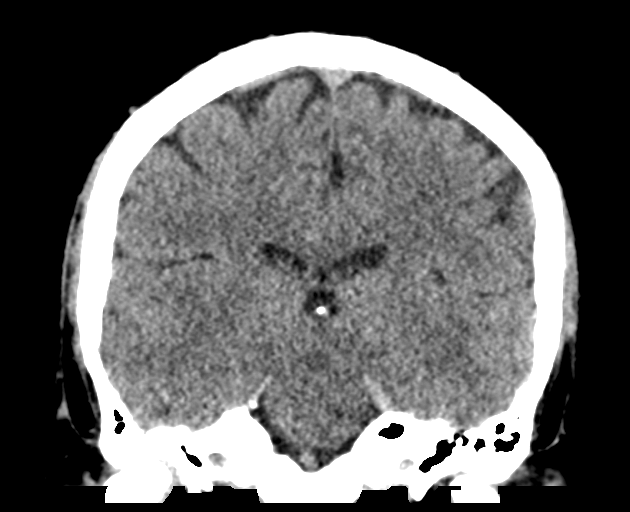

[Series 5: sag soft · sagittal · 0.28mm/px · 3 of 61 slices shown]
[im 21/61  brain]
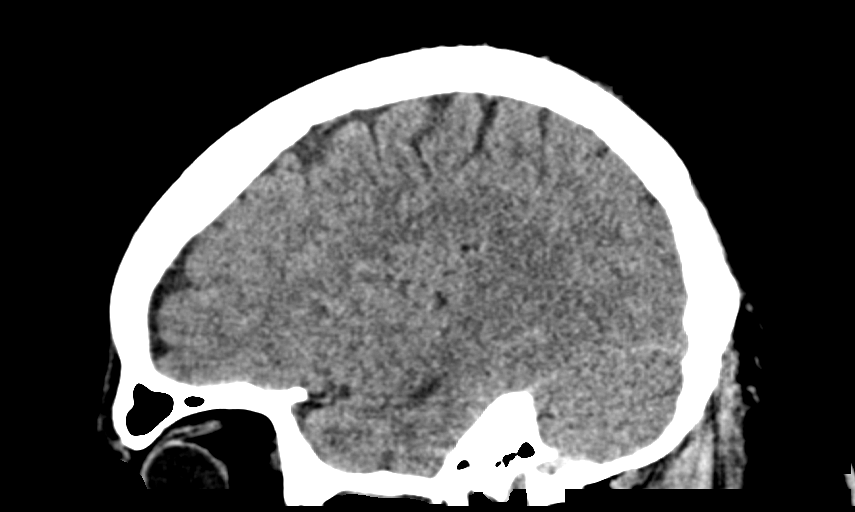
[im 31/61  brain]
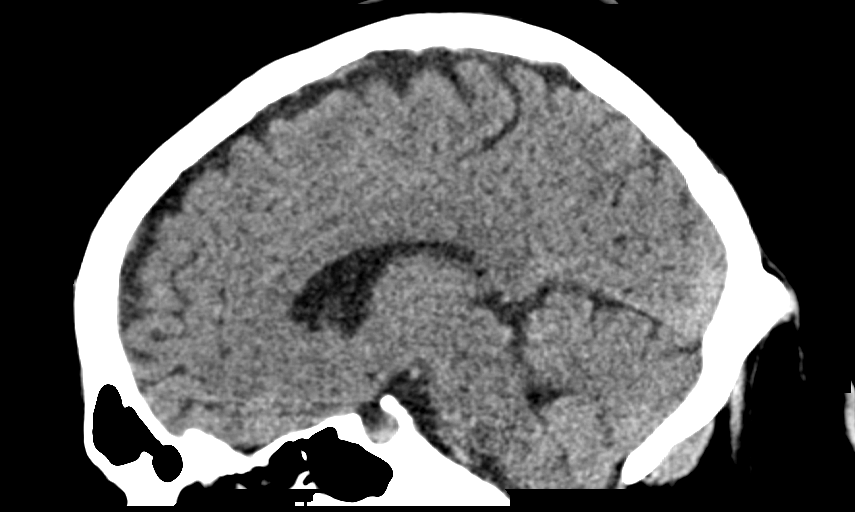
[im 41/61  brain]
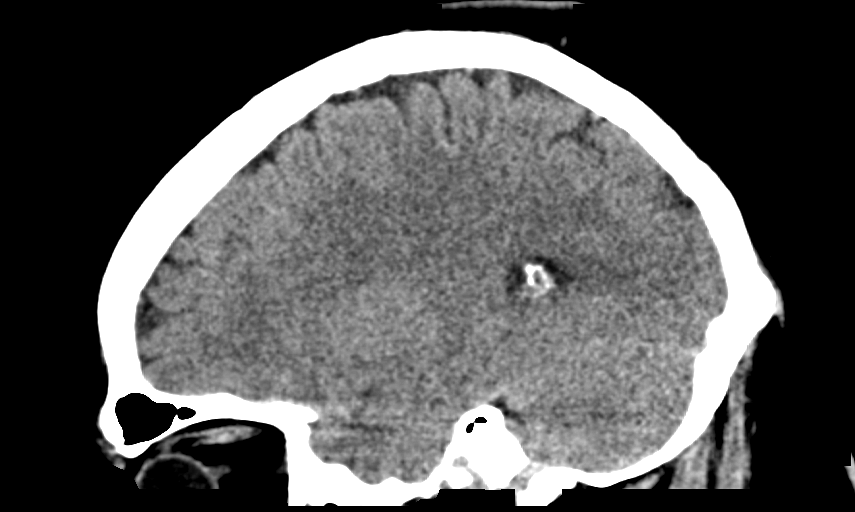

[14 of 47 positions shown; findings below may reference images not displayed]

FINDINGS: Brain: Ventricles and sulci are appropriate for patient's age. No
evidence for acute cortically based infarct, intracranial
hemorrhage, mass lesion or mass-effect.

Vascular: Unremarkable.

Skull: Intact.

Sinuses/Orbits: Polypoid mucosal thickening involving the ethmoid
air cells and sphenoid sinus. Mastoid air cells are unremarkable.
Orbits are unremarkable.

Other: None.
IMPRESSION: No acute intracranial process.

## 2019-10-14 ENCOUNTER — Other Ambulatory Visit: Payer: Self-pay

## 2019-10-15 ENCOUNTER — Ambulatory Visit: Payer: 59 | Admitting: Medical

## 2019-10-15 VITALS — BP 151/97 | HR 74 | Temp 97.7°F | Resp 16 | Ht 71.0 in | Wt 225.0 lb

## 2019-10-15 DIAGNOSIS — L089 Local infection of the skin and subcutaneous tissue, unspecified: Secondary | ICD-10-CM | POA: Diagnosis not present

## 2019-10-15 MED ORDER — DOXYCYCLINE HYCLATE 100 MG PO TABS: 100.0000 mg | ORAL_TABLET | Freq: Two times a day (BID) | ORAL | 0 refills | Status: DC

## 2019-10-15 NOTE — Progress Notes (Signed)
   Subjective:    Patient ID: Roger Salinas, male    DOB: [birth year], 29 y.o.   MRN: [id number]  HPI  Pt states he cut his finger at home.Pt states placed suture 7 days. Told to get removed in 7 days.   Yesterday morning he pulled out his sutures(he state sutures were lose anyway). One of area where he pulled suturessaw yellow dc. Yesterday the finger pad middle finger throbbed.  Pt went to [facility]. He was offered tetanus vaccine but declined. Last tdap was 2018 per epic.  Pt is rt handed.     Review of Systems     Objective:   Physical Exam   General- No acute distress. Pleasant patient. Lungs- Clear, even and unlabored. Heart- regular rate and rhythm. Neurologic- CNII- XII grossly intact. Skin- 3rd digit pad 10 mm length well healing laceration. Edges opposed well. One corner mild inflamed and pink appearance. No active dc presently. Rt 3rd digit- normal range of motion.     Assessment & Plan:  You appear to have well healed finger pad laceration but recent yellow dc when removed sutures indicate infection though on exam appears low level presently.  Start doxycycline antibiotic. Rx advisement. Apply bandaid.   Follow up 5-7 days or as needed(any persisting or worsening  signs/symptoms)  15 minutes spent with pt today.

## 2019-10-15 NOTE — Patient Instructions (Signed)
You appear to have well healed finger pad laceration but recent yellow dc when removed sutures indicate infection though on exam appears low level presently.  Start doxycycline antibiotic. Rx advisement. Apply bandaid.   Follow up 5-7 days or as needed(any persisting or worsening signs/symptoms)

## 2019-10-22 ENCOUNTER — Other Ambulatory Visit: Payer: Self-pay

## 2019-10-23 ENCOUNTER — Other Ambulatory Visit: Payer: Self-pay

## 2019-10-23 ENCOUNTER — Ambulatory Visit: Payer: 59 | Admitting: Family Medicine

## 2019-10-23 ENCOUNTER — Encounter: Payer: Self-pay | Admitting: Family Medicine

## 2019-10-23 VITALS — BP 164/100 | HR 102 | Temp 97.6°F | Ht 71.0 in | Wt 226.0 lb

## 2019-10-23 DIAGNOSIS — R030 Elevated blood-pressure reading, without diagnosis of hypertension: Secondary | ICD-10-CM

## 2019-10-23 DIAGNOSIS — I889 Nonspecific lymphadenitis, unspecified: Secondary | ICD-10-CM | POA: Diagnosis not present

## 2019-10-23 MED ORDER — PREDNISONE 20 MG PO TABS: 40.0000 mg | ORAL_TABLET | Freq: Every day | ORAL | 0 refills | Status: DC

## 2019-10-23 NOTE — Progress Notes (Signed)
Chief Complaint  Patient presents with  . Mass    neck    Roger Salinas is a 29 y.o. male here for a skin complaint.  Duration: 3 days Location: back of scalp on R Pruritic? No Painful? Yes Drainage? No New soaps/lotions/topicals/detergents? No Sick contacts? No Other associated symptoms: None Therapies tried thus far: Doxy finished today  ROS:  Const: No fevers Skin: As noted in HPI  Past Medical History:  Diagnosis Date  . Essential hypertension 07/03/2017  . Obstructive sleep apnea 06/03/2017    BP (!) 164/100 (BP Location: Left Arm, Patient Position: Sitting, Cuff Size: Normal)   Pulse (!) 102   Temp 97.6 F (36.4 C) (Temporal)   Ht 5\' 11"  (1.803 m)   Wt 226 lb (102.5 kg)   SpO2 98%   BMI 31.52 kg/m  Gen: awake, alert, appearing stated age Lungs: No accessory muscle use Skin: on the posterior R portion scalp there are 2 painful nodules measuring 1.0 x 0.6 cm and another at 0.8 x 0.5 cm.. No drainage, erythema, fluctuance, excoriation Psych: Age appropriate judgment and insight  Lymphadenitis - Plan: predniSONE (DELTASONE) 20 MG tablet  Elevated blood pressure reading  Pred burst as he just finished doxy today. Will trial Augmentin if no better by Mon. For BP, ck at home. Will see in 1 mo for CPE, can address there as well. The patient voiced understanding and agreement to the plan.  [place], DO 10/23/19 3:42 PM

## 2019-10-23 NOTE — Patient Instructions (Signed)
Things to look out for: increasing pain not relieved by ibuprofen/acetaminophen, fevers, spreading redness, drainage of pus, or foul odor.  Send me a message on Monday if no better.  Ice/cold pack over area for 10-15 min twice daily.  OK to take Tylenol 1000 mg (2 extra strength tabs) or 975 mg (3 regular strength tabs) every 6 hours as needed.  Let us know if you need anything.

## 2019-10-24 ENCOUNTER — Encounter: Payer: Self-pay | Admitting: Family Medicine

## 2019-10-26 ENCOUNTER — Encounter: Payer: Self-pay | Admitting: Family Medicine

## 2019-10-26 ENCOUNTER — Other Ambulatory Visit: Payer: Self-pay

## 2019-10-26 ENCOUNTER — Ambulatory Visit: Payer: 59 | Admitting: Family Medicine

## 2019-10-26 VITALS — BP 158/100 | HR 92 | Temp 98.0°F | Ht 71.0 in | Wt 222.5 lb

## 2019-10-26 DIAGNOSIS — I10 Essential (primary) hypertension: Secondary | ICD-10-CM | POA: Diagnosis not present

## 2019-10-26 MED ORDER — AMLODIPINE BESYLATE 5 MG PO TABS: 5.0000 mg | ORAL_TABLET | Freq: Every day | ORAL | 3 refills | Status: DC

## 2019-10-26 NOTE — Patient Instructions (Signed)
If your blood pressure is consistently <110 on the to and <70 on the bottom. If you have questions, send me a message.   Keep the diet clean and stay active.  OK to take the prednisone if needed for the pain.  Let us know if you need anything.

## 2019-10-26 NOTE — Progress Notes (Signed)
Chief Complaint  Patient presents with  . Hypertension  . Dizziness    Subjective: Patient is a 29 y.o. male here for f/u.  Hypertension Patient presents for hypertension follow up. He does monitor home blood pressures. Blood pressures ranging on average from 150-160's/90's. He lost a lot of wt and was able to come off of Norvasc.  He is adhering to a healthy diet overall. Exercise: weights, cardio +famhx of morbid obesity and htn.  ROS: Heart: Denies chest pain  Lungs: Denies SOB   Past Medical History:  Diagnosis Date  . Essential hypertension 07/03/2017  . Obstructive sleep apnea 06/03/2017    Objective: BP (!) 158/100 (BP Location: Left Arm, Patient Position: Sitting, Cuff Size: Normal)   Pulse 92   Temp 98 F (36.7 C) (Temporal)   Ht 5\' 11"  (1.803 m)   Wt 222 lb 8 oz (100.9 kg)   SpO2 98%   BMI 31.03 kg/m  General: Awake, appears stated age Heart: RRR, no bruits, no LE edema Lungs: CTAB, no rales, wheezes or rhonchi. No accessory muscle use Psych: Age appropriate judgment and insight, normal affect and mood  Assessment and Plan: Essential hypertension - Plan: amLODipine (NORVASC) 5 MG tablet  Add back CCB. Counseled on diet/exercise. Ck BP's at home. F/u in 4 weeks for CPE. Did mention swollen glands. Could be allergies. Will ck CBC at CPE.  The patient voiced understanding and agreement to the plan.  [place], DO 10/26/19  2:57 PM

## 2019-12-01 ENCOUNTER — Other Ambulatory Visit: Payer: Self-pay

## 2019-12-02 ENCOUNTER — Encounter: Payer: Self-pay | Admitting: Family Medicine

## 2019-12-02 ENCOUNTER — Ambulatory Visit (INDEPENDENT_AMBULATORY_CARE_PROVIDER_SITE_OTHER): Payer: 59 | Admitting: Family Medicine

## 2019-12-02 ENCOUNTER — Other Ambulatory Visit: Payer: Self-pay

## 2019-12-02 VITALS — BP 142/98 | HR 73 | Temp 96.6°F | Ht 71.0 in | Wt 224.1 lb

## 2019-12-02 DIAGNOSIS — Z0000 Encounter for general adult medical examination without abnormal findings: Secondary | ICD-10-CM

## 2019-12-02 LAB — LIPID PANEL
Cholesterol: 159 mg/dL (ref 0–200)
HDL: 53.5 mg/dL (ref >39.00)
LDL Cholesterol: 89 mg/dL (ref 0–99)
NonHDL: 105.97
Total CHOL/HDL Ratio: 3
Triglycerides: 86 mg/dL (ref 0.0–149.0)
VLDL: 17.2 mg/dL (ref 0.0–40.0)

## 2019-12-02 LAB — COMPREHENSIVE METABOLIC PANEL
ALT: 14 U/L (ref 0–53)
AST: 13 U/L (ref 0–37)
Albumin: 4.1 g/dL (ref 3.5–5.2)
Alkaline Phosphatase: 113 U/L (ref 39–117)
BUN: 13 mg/dL (ref 6–23)
CO2: 35 mEq/L — ABNORMAL HIGH (ref 19–32)
Calcium: 9.4 mg/dL (ref 8.4–10.5)
Chloride: 102 mEq/L (ref 96–112)
Creatinine, Ser: 0.77 mg/dL (ref 0.40–1.50)
GFR: 117.93 mL/min (ref >60.00)
Glucose, Bld: 95 mg/dL (ref 70–99)
Potassium: 4.4 mEq/L (ref 3.5–5.1)
Sodium: 141 mEq/L (ref 135–145)
Total Bilirubin: 0.8 mg/dL (ref 0.2–1.2)
Total Protein: 6.5 g/dL (ref 6.0–8.3)

## 2019-12-02 NOTE — Patient Instructions (Signed)
Keep the diet clean and stay active.  Give us 2-3 business days to get the results of your labs back.   Do monthly self testicular checks in the shower. You are feeling for lumps/bumps that don't belong. If you feel anything like this, let me know!  Let us know if you need anything. 

## 2019-12-02 NOTE — Progress Notes (Signed)
Chief Complaint  Patient presents with  . Annual Exam    Well Male Roger Salinas is here for a complete physical.   His last physical was >1 year ago.  Current diet: in general, a "healthy" diet.   Current exercise: walking Weight trend: stable Daytime fatigue? No. Seat belt? Yes.    Health maintenance Tetanus- Yes HIV- Yes  Past Medical History:  Diagnosis Date  . Essential hypertension 07/03/2017  . Obstructive sleep apnea 06/03/2017     Past Surgical History:  Procedure Laterality Date  . TONSILLECTOMY      Medications  Current Outpatient Medications on File Prior to Visit  Medication Sig Dispense Refill  . amLODipine (NORVASC) 5 MG tablet Take 1 tablet (5 mg total) by mouth daily. 30 tablet 3  . cholecalciferol (VITAMIN D) 1000 units tablet Take 1,000 Units by mouth 2 (two) times daily.    . Multiple Vitamin (MULTIVITAMIN) tablet Take 1 tablet by mouth daily.      Allergies Allergies  Allergen Reactions  . Lisinopril Cough    Family History Family History  Problem Relation Age of Onset  . Hypertension Mother   . Diabetes Mother   . Diabetes Maternal Uncle   . Diabetes Maternal Grandmother   . Diabetes Maternal Uncle   . Heart attack Neg Hx   . Hyperlipidemia Neg Hx   . Sudden death Neg Hx     Review of Systems: Constitutional: no fevers or chills Eye:  no recent significant change in vision Ear/Nose/Mouth/Throat:  Ears:  no hearing loss Nose/Mouth/Throat:  no complaints of nasal congestion, no sore throat Cardiovascular:  no chest pain Respiratory:  no shortness of breath Gastrointestinal:  no abdominal pain, no change in bowel habits GU:  Male: negative for dysuria, frequency, and incontinence Musculoskeletal/Extremities:  no pain of the joints Integumentary (Skin/Breast):  no abnormal skin lesions reported Neurologic:  no headaches Endocrine: No unexpected weight changes Hematologic/Lymphatic:  no night sweats  Exam BP (!) 142/98 (BP  Location: Left Arm, Patient Position: Sitting, Cuff Size: Normal)   Pulse 73   Temp (!) 96.6 F (35.9 C) (Temporal)   Ht 5\' 11"  (1.803 m)   Wt 224 lb 2 oz (101.7 kg)   SpO2 98%   BMI 31.26 kg/m  General:  well developed, well nourished, in no apparent distress Skin:  no significant moles, warts, or growths Head:  no masses, lesions, or tenderness Eyes:  pupils equal and round, sclera anicteric without injection Ears:  canals without lesions, TMs shiny without retraction, no obvious effusion, no erythema Nose:  nares patent, septum midline, mucosa normal Throat/Pharynx:  lips and gingiva without lesion; tongue and uvula midline; non-inflamed pharynx; no exudates or postnasal drainage Neck: neck supple without adenopathy, thyromegaly, or masses Lungs:  clear to auscultation, breath sounds equal bilaterally, no respiratory distress Cardio:  regular rate and rhythm, no bruits, no LE edema Abdomen:  abdomen soft, nontender; bowel sounds normal; no masses or organomegaly Rectal: Deferred Musculoskeletal:  symmetrical muscle groups noted without atrophy or deformity Extremities:  no clubbing, cyanosis, or edema, no deformities, no skin discoloration Neuro:  gait normal; deep tendon reflexes normal and symmetric Psych: well oriented with a flat affect and appropriate judgment/insight  Assessment and Plan  Well adult exam - Plan: CBC, Comprehensive metabolic panel, Lipid panel   Well 29 y.o. male. Counseled on diet and exercise. Self testicular exams recommended at least monthly.  BP's had been normal when he takes Norvasc.  Other orders as above.  Follow up in 6 mo pending the above workup. The patient voiced understanding and agreement to the plan.  [place], DO 12/02/19 8:10 AM

## 2019-12-03 LAB — CBC
HCT: 45.6 % (ref 39.0–52.0)
Hemoglobin: 16 g/dL (ref 13.0–17.0)
MCHC: 35 g/dL (ref 30.0–36.0)
MCV: 85.6 fl (ref 78.0–100.0)
Platelets: 206 10*3/uL (ref 150.0–400.0)
RBC: 5.33 Mil/uL (ref 4.22–5.81)
RDW: 13.1 % (ref 11.5–15.5)
WBC: 8.7 10*3/uL (ref 4.0–10.5)

## 2019-12-30 ENCOUNTER — Other Ambulatory Visit: Payer: Self-pay | Admitting: Family Medicine

## 2019-12-30 DIAGNOSIS — I10 Essential (primary) hypertension: Secondary | ICD-10-CM

## 2020-06-03 ENCOUNTER — Ambulatory Visit (INDEPENDENT_AMBULATORY_CARE_PROVIDER_SITE_OTHER): Payer: BC Managed Care – PPO | Admitting: Family Medicine

## 2020-06-03 ENCOUNTER — Other Ambulatory Visit: Payer: Self-pay

## 2020-06-03 ENCOUNTER — Encounter: Payer: Self-pay | Admitting: Family Medicine

## 2020-06-03 VITALS — BP 138/98 | HR 81 | Temp 98.1°F | Ht 71.0 in | Wt 225.2 lb

## 2020-06-03 DIAGNOSIS — I10 Essential (primary) hypertension: Secondary | ICD-10-CM | POA: Diagnosis not present

## 2020-06-03 MED ORDER — AMLODIPINE BESYLATE 5 MG PO TABS: 5.0000 mg | ORAL_TABLET | Freq: Every day | ORAL | 1 refills | Status: DC

## 2020-06-03 NOTE — Patient Instructions (Addendum)
Keep the diet clean and stay active.  Check your blood pressure at home 2-3 times per week. Write them down and send me a message in 2-3 weeks with results.   Continue on the Norvasc for now.  Let us know if you need anything.

## 2020-06-03 NOTE — Progress Notes (Signed)
Chief Complaint  Patient presents with  . Follow-up    Subjective Roger Salinas is a 29 y.o. male who presents for hypertension follow up. He does not monitor home blood pressures. He is compliant with medication- Norvasc 5 mg/d. Patient has these side effects of medication: none He is usually adhering to a healthy diet overall. Current exercise: walking   Past Medical History:  Diagnosis Date  . Essential hypertension 07/03/2017  . Obstructive sleep apnea 06/03/2017    Exam BP (!) 138/98 (BP Location: Left Arm, Patient Position: Sitting, Cuff Size: Normal)   Pulse 81   Temp 98.1 F (36.7 C) (Oral)   Ht 5\' 11"  (1.803 m)   Wt 225 lb 4 oz (102.2 kg)   SpO2 99%   BMI 31.42 kg/m  General:  well developed, well nourished, in no apparent distress Heart: RRR, no bruits, no LE edema Lungs: clear to auscultation, no accessory muscle use Psych: well oriented with normal range of affect and appropriate judgment/insight  Essential hypertension - Plan: amLODipine (NORVASC) 5 MG tablet  Status: Uncontrolled. Counseled on diet and exercise. Pt states he is nervous being here. He will ck BP at home and send me his readings in 2-3 weeks. Cont Norvasc 5 mg/d.  F/u prn for toenail removal, 6 mo for CPE. The patient voiced understanding and agreement to the plan.  [place], DO 06/03/20  7:42 AM

## 2020-06-13 ENCOUNTER — Other Ambulatory Visit: Payer: Self-pay

## 2020-06-13 ENCOUNTER — Encounter: Payer: Self-pay | Admitting: Family Medicine

## 2020-06-13 ENCOUNTER — Ambulatory Visit (INDEPENDENT_AMBULATORY_CARE_PROVIDER_SITE_OTHER): Payer: BC Managed Care – PPO | Admitting: Family Medicine

## 2020-06-13 VITALS — BP 160/100 | HR 86 | Temp 98.0°F | Ht 70.0 in | Wt 222.4 lb

## 2020-06-13 DIAGNOSIS — L600 Ingrowing nail: Secondary | ICD-10-CM

## 2020-06-13 DIAGNOSIS — I10 Essential (primary) hypertension: Secondary | ICD-10-CM | POA: Diagnosis not present

## 2020-06-13 MED ORDER — VALSARTAN 160 MG PO TABS: 160.0000 mg | ORAL_TABLET | Freq: Every day | ORAL | 3 refills | Status: DC

## 2020-06-13 NOTE — Patient Instructions (Addendum)
Do not shower for the rest of the day. When you do wash it, use only soap and water. Do not vigorously scrub. Apply triple antibiotic ointment (like Neosporin) twice daily. Keep the area clean and dry.   Things to look out for: increasing pain not relieved by ibuprofen/acetaminophen, fevers, spreading redness, drainage of pus, or foul odor.  Betadine or iodine soaks twice daily for 10-15 minutes at a time. This is very important!  Keep the diet clean and stay active.  Continue to check your blood pressures over the next week.   Let us know if you need anything.

## 2020-06-13 NOTE — Progress Notes (Signed)
Chief Complaint  Patient presents with  . Ingrown Toenail    Subjective Roger Salinas is a 29 y.o. male who presents for hypertension follow up. He does monitor home blood pressures. Blood pressures ranging from 140's/90's on average. He is compliant with medication- Norvasc 5 mg/d. Patient has these side effects of medication: none He is adhering to a healthy diet overall. Current exercise: active at work  Here to have ingrown toenail on R great toe removed also.    Past Medical History:  Diagnosis Date  . Essential hypertension 07/03/2017  . Obstructive sleep apnea 06/03/2017    Exam BP (!) 160/100 (BP Location: Left Arm, Patient Position: Sitting, Cuff Size: Normal)   Pulse 86   Temp 98 F (36.7 C) (Oral)   Ht 5\' 10"  (1.778 m)   Wt 222 lb 6 oz (100.9 kg)   SpO2 98%   BMI 31.91 kg/m  General:  well developed, well nourished, in no apparent distress Heart: RRR, no bruits, no LE edema Lungs: clear to auscultation, no accessory muscle use Skin:  Psych: well oriented with normal range of affect  and appropriate judgment/insight  Procedure note; partial toenail removal Informed consent obtained. The area was anesthetized with a four corner ring block using 1.5 mL of 2% lidocaine without epinephrine in each quadrant, a total of 6 mL. A tourniquet was placed around the base of the toe. Several minutes past while the initial anesthesia started to work. A straight hemostat used to elevate the nail and scissors were used to cut the nail.  The straight hemostat was used to wrest the nail from position.  Adequate hemostasis was obtained after the tourniquet was removed. The patient tolerated the procedure well. There were no complications noted. He elected to take the specimen home.   Essential hypertension - Plan: valsartan (DIOVAN) 160 MG tablet  Ingrown toenail of right foot - Plan: Nail removal  1.  Status: Chronic, uncontrolled.  Continue amlodipine 5 mg daily.   Add valsartan 160 mg daily.  If he does well in these dosages, will call in [employer].  Continue to monitor blood pressure at home.  We will recheck BMP and blood pressure in 1 week.  Counseled on diet and exercise. 2.  Toenail successfully removed today.  Aftercare instructions and warning signs/symptoms verbalized and written down.  Betadine soaks at least twice daily were encouraged.  I will see him in 1 week for a wound check. The patient voiced understanding and agreement to the plan.  [place], DO 06/13/20  3:59 PM

## 2020-06-20 ENCOUNTER — Other Ambulatory Visit: Payer: Self-pay

## 2020-06-20 ENCOUNTER — Ambulatory Visit (INDEPENDENT_AMBULATORY_CARE_PROVIDER_SITE_OTHER): Payer: BC Managed Care – PPO | Admitting: Family Medicine

## 2020-06-20 ENCOUNTER — Encounter: Payer: Self-pay | Admitting: Family Medicine

## 2020-06-20 DIAGNOSIS — L600 Ingrowing nail: Secondary | ICD-10-CM | POA: Diagnosis not present

## 2020-06-20 DIAGNOSIS — Z09 Encounter for follow-up examination after completed treatment for conditions other than malignant neoplasm: Secondary | ICD-10-CM

## 2020-06-20 NOTE — Progress Notes (Signed)
Chief Complaint  Patient presents with  . Follow-up    procedure   Patient is here to follow-up after a partial toenail avulsion done 1 week ago.  He is compliant with his Betadine soaks.  He is not having any pain, drainage, redness, or fevers.  He has no questions.  Follow up  Patient is progressing well.  No changes.  All questions answered to satisfaction.  He was understanding and agreement with the plan.  [name] [name] 3:51 PM 06/20/20

## 2020-12-05 ENCOUNTER — Other Ambulatory Visit: Payer: Self-pay

## 2020-12-05 ENCOUNTER — Encounter: Payer: Self-pay | Admitting: Family Medicine

## 2020-12-05 ENCOUNTER — Other Ambulatory Visit: Payer: Self-pay | Admitting: Family Medicine

## 2020-12-05 ENCOUNTER — Ambulatory Visit (INDEPENDENT_AMBULATORY_CARE_PROVIDER_SITE_OTHER): Payer: BC Managed Care – PPO | Admitting: Family Medicine

## 2020-12-05 VITALS — BP 130/80 | HR 73 | Temp 97.6°F | Ht 71.0 in | Wt 233.0 lb

## 2020-12-05 DIAGNOSIS — Z0000 Encounter for general adult medical examination without abnormal findings: Secondary | ICD-10-CM

## 2020-12-05 DIAGNOSIS — Z1159 Encounter for screening for other viral diseases: Secondary | ICD-10-CM

## 2020-12-05 DIAGNOSIS — I10 Essential (primary) hypertension: Secondary | ICD-10-CM | POA: Diagnosis not present

## 2020-12-05 DIAGNOSIS — R748 Abnormal levels of other serum enzymes: Secondary | ICD-10-CM

## 2020-12-05 LAB — COMPREHENSIVE METABOLIC PANEL
ALT: 11 U/L (ref 0–53)
AST: 12 U/L (ref 0–37)
Albumin: 3.9 g/dL (ref 3.5–5.2)
Alkaline Phosphatase: 137 U/L — ABNORMAL HIGH (ref 39–117)
BUN: 10 mg/dL (ref 6–23)
CO2: 30 mEq/L (ref 19–32)
Calcium: 9.3 mg/dL (ref 8.4–10.5)
Chloride: 105 mEq/L (ref 96–112)
Creatinine, Ser: 0.82 mg/dL (ref 0.40–1.50)
GFR: 116.74 mL/min (ref >60.00)
Glucose, Bld: 94 mg/dL (ref 70–99)
Potassium: 4.1 mEq/L (ref 3.5–5.1)
Sodium: 141 mEq/L (ref 135–145)
Total Bilirubin: 0.6 mg/dL (ref 0.2–1.2)
Total Protein: 6.5 g/dL (ref 6.0–8.3)

## 2020-12-05 LAB — LIPID PANEL
Cholesterol: 140 mg/dL (ref 0–200)
HDL: 44 mg/dL (ref >39.00)
LDL Cholesterol: 81 mg/dL (ref 0–99)
NonHDL: 95.99
Total CHOL/HDL Ratio: 3
Triglycerides: 77 mg/dL (ref 0.0–149.0)
VLDL: 15.4 mg/dL (ref 0.0–40.0)

## 2020-12-05 LAB — CBC
HCT: 43.8 % (ref 39.0–52.0)
Hemoglobin: 15.4 g/dL (ref 13.0–17.0)
MCHC: 35.2 g/dL (ref 30.0–36.0)
MCV: 81.6 fl (ref 78.0–100.0)
Platelets: 190 10*3/uL (ref 150.0–400.0)
RBC: 5.37 Mil/uL (ref 4.22–5.81)
RDW: 13.1 % (ref 11.5–15.5)
WBC: 6.6 10*3/uL (ref 4.0–10.5)

## 2020-12-05 NOTE — Progress Notes (Signed)
Chief Complaint  Patient presents with  . Annual Exam    Well Male Roger Salinas is here for a complete physical.   His last physical was >1 year ago.  Current diet: in general, diet is healthy overall.   Current exercise: walking Weight trend: stable Fatigue out of ordinary? No. Seat belt? Yes.    Health maintenance Tetanus- Yes HIV- Yes Hep C- No  Past Medical History:  Diagnosis Date  . Essential hypertension 07/03/2017  . Obstructive sleep apnea 06/03/2017     Past Surgical History:  Procedure Laterality Date  . TONSILLECTOMY      Medications  Current Outpatient Medications on File Prior to Visit  Medication Sig Dispense Refill  . amLODipine (NORVASC) 5 MG tablet Take 1 tablet (5 mg total) by mouth daily. 90 tablet 1  . cholecalciferol (VITAMIN D) 1000 units tablet Take 1,000 Units by mouth 2 (two) times daily.    . Multiple Vitamin (MULTIVITAMIN) tablet Take 1 tablet by mouth daily.    . valsartan (DIOVAN) 160 MG tablet Take 1 tablet (160 mg total) by mouth daily. 30 tablet 3    Allergies Allergies  Allergen Reactions  . Lisinopril Cough    Family History Family History  Problem Relation Age of Onset  . Hypertension Mother   . Diabetes Mother   . Diabetes Maternal Uncle   . Diabetes Maternal Grandmother   . Diabetes Maternal Uncle   . Heart attack Neg Hx   . Hyperlipidemia Neg Hx   . Sudden death Neg Hx     Review of Systems: Constitutional: no fevers or chills Eye:  no recent significant change in vision Ear/Nose/Mouth/Throat:  Ears:  no hearing loss Nose/Mouth/Throat:  no complaints of nasal congestion, no sore throat Cardiovascular:  no chest pain Respiratory:  no shortness of breath Gastrointestinal:  no abdominal pain, no change in bowel habits GU:  Male: negative for dysuria Musculoskeletal/Extremities:  no pain of the joints Integumentary (Skin/Breast):  no abnormal skin lesions reported Neurologic:  no headaches Endocrine: No  unexpected weight changes Hematologic/Lymphatic:  no night sweats  Exam BP 130/80 (BP Location: Left Arm, Patient Position: Sitting, Cuff Size: Normal)   Pulse 73   Temp 97.6 F (36.4 C) (Oral)   Ht 5\' 11"  (1.803 m)   Wt 233 lb (105.7 kg)   SpO2 96%   BMI 32.50 kg/m  General:  well developed, well nourished, in no apparent distress Skin:  no significant moles, warts, or growths Head:  no masses, lesions, or tenderness Eyes:  pupils equal and round, sclera anicteric without injection Ears:  canals without lesions, TMs shiny without retraction, no obvious effusion, no erythema Nose:  nares patent, septum midline, mucosa normal Throat/Pharynx:  lips and gingiva without lesion; tongue and uvula midline; non-inflamed pharynx; no exudates or postnasal drainage Neck: neck supple without adenopathy, thyromegaly, or masses Lungs:  clear to auscultation, breath sounds equal bilaterally, no respiratory distress Cardio:  regular rate and rhythm, no bruits, no LE edema Abdomen:  abdomen soft, nontender; bowel sounds normal; no masses or organomegaly Genital (male): Deferred Rectal: Deferred Musculoskeletal:  symmetrical muscle groups noted without atrophy or deformity Extremities:  no clubbing, cyanosis, or edema, no deformities, no skin discoloration Neuro:  gait normal; deep tendon reflexes normal and symmetric Psych: well oriented with normal range of affect and appropriate judgment/insight  Assessment and Plan  Well adult exam - Plan: Lipid panel, Comprehensive metabolic panel  Essential hypertension - Plan: CBC  Encounter for hepatitis C  screening test for low risk patient - Plan: Hepatitis C antibody   Well 30 y.o. male. Counseled on diet and exercise. Self testicular exams recommended at least monthly.  Other orders as above. Follow up in 6 mo pending the above workup. The patient voiced understanding and agreement to the plan.  [name] [place], DO 12/05/20 7:08  AM

## 2020-12-05 NOTE — Patient Instructions (Signed)
Give us 2-3 business days to get the results of your labs back.   Keep the diet clean and stay active.  Do monthly self testicular checks in the shower. You are feeling for lumps/bumps that don't belong. If you feel anything like this, let me know!  Let us know if you need anything. 

## 2020-12-06 LAB — HEPATITIS C ANTIBODY
Hepatitis C Ab: NONREACTIVE
SIGNAL TO CUT-OFF: 0 (ref <1.00)

## 2020-12-13 DIAGNOSIS — R1033 Periumbilical pain: Secondary | ICD-10-CM | POA: Diagnosis not present

## 2020-12-13 DIAGNOSIS — I10 Essential (primary) hypertension: Secondary | ICD-10-CM | POA: Diagnosis not present

## 2020-12-13 DIAGNOSIS — K828 Other specified diseases of gallbladder: Secondary | ICD-10-CM | POA: Diagnosis not present

## 2020-12-13 DIAGNOSIS — Z79899 Other long term (current) drug therapy: Secondary | ICD-10-CM | POA: Diagnosis not present

## 2020-12-13 DIAGNOSIS — S39011A Strain of muscle, fascia and tendon of abdomen, initial encounter: Secondary | ICD-10-CM | POA: Diagnosis not present

## 2020-12-13 DIAGNOSIS — Z888 Allergy status to other drugs, medicaments and biological substances status: Secondary | ICD-10-CM | POA: Diagnosis not present

## 2020-12-13 DIAGNOSIS — R7989 Other specified abnormal findings of blood chemistry: Secondary | ICD-10-CM | POA: Diagnosis not present

## 2020-12-13 DIAGNOSIS — E7800 Pure hypercholesterolemia, unspecified: Secondary | ICD-10-CM | POA: Diagnosis not present

## 2020-12-13 DIAGNOSIS — R109 Unspecified abdominal pain: Secondary | ICD-10-CM | POA: Diagnosis not present

## 2020-12-13 DIAGNOSIS — X58XXXA Exposure to other specified factors, initial encounter: Secondary | ICD-10-CM | POA: Diagnosis not present

## 2020-12-13 DIAGNOSIS — Z9884 Bariatric surgery status: Secondary | ICD-10-CM | POA: Diagnosis not present

## 2020-12-13 DIAGNOSIS — G4733 Obstructive sleep apnea (adult) (pediatric): Secondary | ICD-10-CM | POA: Diagnosis not present

## 2020-12-19 ENCOUNTER — Other Ambulatory Visit: Payer: Self-pay | Admitting: Family Medicine

## 2020-12-19 ENCOUNTER — Other Ambulatory Visit: Payer: Self-pay

## 2020-12-19 ENCOUNTER — Other Ambulatory Visit (INDEPENDENT_AMBULATORY_CARE_PROVIDER_SITE_OTHER): Payer: BC Managed Care – PPO

## 2020-12-19 DIAGNOSIS — R748 Abnormal levels of other serum enzymes: Secondary | ICD-10-CM

## 2020-12-19 DIAGNOSIS — R7401 Elevation of levels of liver transaminase levels: Secondary | ICD-10-CM

## 2020-12-19 LAB — HEPATIC FUNCTION PANEL
ALT: 85 U/L — ABNORMAL HIGH (ref 0–53)
AST: 39 U/L — ABNORMAL HIGH (ref 0–37)
Albumin: 4.4 g/dL (ref 3.5–5.2)
Alkaline Phosphatase: 169 U/L — ABNORMAL HIGH (ref 39–117)
Bilirubin, Direct: 0.2 mg/dL (ref 0.0–0.3)
Total Bilirubin: 0.7 mg/dL (ref 0.2–1.2)
Total Protein: 6.8 g/dL (ref 6.0–8.3)

## 2020-12-21 ENCOUNTER — Other Ambulatory Visit: Payer: Self-pay

## 2020-12-21 ENCOUNTER — Ambulatory Visit (HOSPITAL_BASED_OUTPATIENT_CLINIC_OR_DEPARTMENT_OTHER)
Admission: RE | Admit: 2020-12-21 | Discharge: 2020-12-21 | Disposition: A | Discharge status: Home / Self Care | Payer: BC Managed Care – PPO | Source: Ambulatory Visit | Attending: Family Medicine | Admitting: Family Medicine

## 2020-12-21 DIAGNOSIS — R7401 Elevation of levels of liver transaminase levels: Secondary | ICD-10-CM | POA: Diagnosis not present

## 2020-12-21 DIAGNOSIS — K8020 Calculus of gallbladder without cholecystitis without obstruction: Secondary | ICD-10-CM | POA: Diagnosis not present

## 2020-12-22 ENCOUNTER — Other Ambulatory Visit: Payer: Self-pay | Admitting: Family Medicine

## 2020-12-22 DIAGNOSIS — R7989 Other specified abnormal findings of blood chemistry: Secondary | ICD-10-CM

## 2020-12-22 DIAGNOSIS — R748 Abnormal levels of other serum enzymes: Secondary | ICD-10-CM

## 2021-01-25 ENCOUNTER — Other Ambulatory Visit (INDEPENDENT_AMBULATORY_CARE_PROVIDER_SITE_OTHER): Payer: BC Managed Care – PPO

## 2021-01-25 ENCOUNTER — Other Ambulatory Visit: Payer: Self-pay

## 2021-01-25 DIAGNOSIS — R7989 Other specified abnormal findings of blood chemistry: Secondary | ICD-10-CM

## 2021-01-25 DIAGNOSIS — R748 Abnormal levels of other serum enzymes: Secondary | ICD-10-CM

## 2021-01-25 LAB — HEPATIC FUNCTION PANEL
ALT: 11 U/L (ref 0–53)
AST: 12 U/L (ref 0–37)
Albumin: 4.3 g/dL (ref 3.5–5.2)
Alkaline Phosphatase: 130 U/L — ABNORMAL HIGH (ref 39–117)
Bilirubin, Direct: 0.2 mg/dL (ref 0.0–0.3)
Total Bilirubin: 0.7 mg/dL (ref 0.2–1.2)
Total Protein: 6.8 g/dL (ref 6.0–8.3)

## 2021-01-25 LAB — GAMMA GT: GGT: 21 U/L (ref 7–51)

## 2021-01-26 DIAGNOSIS — R7303 Prediabetes: Secondary | ICD-10-CM | POA: Diagnosis not present

## 2021-01-26 DIAGNOSIS — E559 Vitamin D deficiency, unspecified: Secondary | ICD-10-CM | POA: Diagnosis not present

## 2021-01-26 DIAGNOSIS — I10 Essential (primary) hypertension: Secondary | ICD-10-CM | POA: Diagnosis not present

## 2021-01-26 DIAGNOSIS — K912 Postsurgical malabsorption, not elsewhere classified: Secondary | ICD-10-CM | POA: Diagnosis not present

## 2021-01-26 DIAGNOSIS — Z9884 Bariatric surgery status: Secondary | ICD-10-CM | POA: Diagnosis not present

## 2021-01-29 LAB — ALKALINE PHOSPHATASE ISOENZYMES
Alkaline phosphatase (APISO): 130 U/L (ref 36–130)
Bone Isoenzymes: 61 % (ref 28–66)
Intestinal Isoenzymes: 0 % — ABNORMAL LOW (ref 1–24)
Liver Isoenzymes: 39 % (ref 25–69)
Macrohepatic isoenzymes: 0 % (ref <=0)
Placental isoenzymes: 0 % (ref <=0)

## 2021-02-14 DIAGNOSIS — K912 Postsurgical malabsorption, not elsewhere classified: Secondary | ICD-10-CM | POA: Diagnosis not present

## 2021-02-14 DIAGNOSIS — E559 Vitamin D deficiency, unspecified: Secondary | ICD-10-CM | POA: Diagnosis not present

## 2021-02-14 DIAGNOSIS — Z9884 Bariatric surgery status: Secondary | ICD-10-CM | POA: Diagnosis not present

## 2021-02-23 DIAGNOSIS — Z3009 Encounter for other general counseling and advice on contraception: Secondary | ICD-10-CM

## 2021-03-01 DIAGNOSIS — Z3009 Encounter for other general counseling and advice on contraception: Secondary | ICD-10-CM | POA: Diagnosis not present

## 2021-06-07 ENCOUNTER — Other Ambulatory Visit: Payer: Self-pay

## 2021-06-07 ENCOUNTER — Ambulatory Visit (INDEPENDENT_AMBULATORY_CARE_PROVIDER_SITE_OTHER): Payer: BC Managed Care – PPO | Admitting: Family Medicine

## 2021-06-07 ENCOUNTER — Encounter: Payer: Self-pay | Admitting: Family Medicine

## 2021-06-07 VITALS — BP 138/92 | HR 72 | Temp 98.0°F | Ht 71.0 in | Wt 233.5 lb

## 2021-06-07 DIAGNOSIS — M7712 Lateral epicondylitis, left elbow: Secondary | ICD-10-CM

## 2021-06-07 DIAGNOSIS — M722 Plantar fascial fibromatosis: Secondary | ICD-10-CM | POA: Diagnosis not present

## 2021-06-07 DIAGNOSIS — I10 Essential (primary) hypertension: Secondary | ICD-10-CM

## 2021-06-07 MED ORDER — DICLOFENAC SODIUM 1 % EX GEL: 2.0000 g | Freq: Four times a day (QID) | CUTANEOUS | 2 refills | Status: DC

## 2021-06-07 MED ORDER — AMLODIPINE BESYLATE-VALSARTAN 10-160 MG PO TABS
1.0000 | ORAL_TABLET | Freq: Every day | ORAL | 2 refills | Status: DC
Start: 2021-06-07 — End: 2021-09-04

## 2021-06-07 NOTE — Patient Instructions (Addendum)
Keep the diet clean and stay active.  Congratulations on your 3rd child!  I recommend getting the updated bivalent covid vaccination booster at your convenience.   Ice/cold pack over area for 10-15 min twice daily.  Heat (pad or rice pillow in microwave) over affected area, 10-15 minutes twice daily.   OK to take Tylenol 1000 mg (2 extra strength tabs) or 975 mg (3 regular strength tabs) every 6 hours as needed.  Consider getting a forearm strap to help your elbow heal. Any brand would work, but Band-IT is an example.   Use Voltaren gel for your foot.   Let us know if you need anything.  Elbow and Forearm Exercises It is normal to feel mild stretching, pulling, tightness, or discomfort as you do these exercises, but you should stop right away if you feel sudden pain or your pain gets worse.  RANGE OF MOTION EXERCISES These exercises warm up your muscles and joints and improve the movement and flexibility of your injured elbow and forearm. These exercises also help to relieve pain, numbness, and tingling. These exercises are done using the muscles in your injured elbow and forearm. Exercise A: Elbow Flexion, Active Hold your left / right arm at your side, and bend your elbow as far as you can using your left / right arm muscles. Hold this position for 30 seconds. Slowly return to the starting position. Repeat 2 times. Complete this exercise 3 times per week. Exercise B: Elbow Extension, Active Hold your left / right arm at your side, and straighten your elbow as much as you can using your left / right arm muscles. Hold this position for 30 seconds. Slowly return to the starting position. Repeat 2 times. Complete this exercise 3 times per week. Exercise C: Forearm Rotation, Supination, Active Stand or sit with your elbows at your sides. Bend your left / right elbow to an "L" shape (90 degrees). Turn your palm upward until you feel a gentle stretch on the inside of your forearm. Hold  this position for 30 seconds. Slowly release and return to the starting position. Repeat 2 times. Complete this exercise 3 times per week. Exercise D: Forearm Rotation, Pronation, Active Stand or sit with your elbows at your side. Bend your left / right elbow to an "L" shape (90 degrees). Turn your left / right palm downward until you feel a gentle stretch on the top of your forearm. Hold this position for 30 seconds. Slowly release and return to the starting position. Repeat 2 times. Complete this exercise 3 times per week. STRETCHING EXERCISES These exercises warm up your muscles and joints and improve the movement and flexibility of your injured elbow and forearm. These exercises also help to relieve pain, numbness, and tingling. These exercises are done using your healthy elbow and forearm to help stretch the muscles in your injured elbow and forearm. Exercise E: Elbow Flexion, Active-Assisted  Hold your left / right arm at your side, and bend your elbow as much as you can using your left / right arm muscles. Use your other hand to bend your left / right elbow farther. To do this, gently push up on your forearm until you feel a gentle stretch on the back of your elbow. Hold this position for 30 seconds. Slowly return to the starting position. Repeat 2 times. Complete this exercise 3 times per week. Exercise F: Elbow Extension, Active-Assisted  Hold your left / right arm at your side, and straighten your elbow as much  as you can using your left / right arm muscles. Use your other hand to straighten the left / right elbow farther. To do this, gently push down on your forearm until you feel a gentle stretch on the inside of your elbow. Hold this position for 30 seconds. Slowly return to the starting position. Repeat 2 times. Complete this exercise 3 times per weeky. Exercise G: Forearm Rotation, Supination, Active-Assisted  Sit with your left / right elbow bent in an "L" shape (90  degrees) with your forearm resting on a table. Keeping your upper body and shoulder still, rotate your forearm so your left / right palm faces upward. Use your other hand to help rotate your forearm further until you feel a gentle to moderate stretch. Hold this position for 30 seconds. Slowly release the stretch and return to the starting position. Repeat 2 times. Complete this exercise 3 times per week. Exercise H: Forearm Rotation, Pronation, Active-Assisted  Sit with your left / right elbow bent in an "L" shape (90 degrees) with your forearm resting on a table. Keeping your upper body and shoulder still, rotate your forearm so your palm faces the tabletop. Use your other hand to help rotate your forearm further until you feel a gentle to moderate stretch. Hold this position for 30 seconds. Slowly release the stretch and return to the starting position. Repeat 2 times. Complete this exercise 3 times per week. Exercise I: Elbow Flexion, Supine, Passive Lie on your back. Extend your left / right arm up in the air, bracing it with your other hand. Let your left / right your hand slowly lower toward your shoulder, while your elbow stays pointed toward the ceiling. You should feel a gentle stretch along the back of your upper arm and elbow. If instructed by your health care provider, you may increase the intensity of your stretch by adding a small wrist weight or hand weight. Hold this position for 3 seconds. Slowly return to the starting position. Repeat 2 times. Complete this exercise 3 times per week. Exercise J: Elbow Extension, Supine, Passive  Lie on your back. Make sure that you are in a comfortable position that lets you relax your arm muscles. Place a folded towel under your left / right upper arm so your elbow and shoulder are at the same height. Straighten your left / right arm so your elbow does not rest on the bed or towel. Let the weight of your hand stretch your elbow. Keep your  arm and chest muscles relaxed. You should feel a stretch on the inside of your elbow. If told by your health care provider, you may increase the intensity of your stretch by adding a small wrist weight or hand weight. Hold this position for 30 seconds. Slowly release the stretch. Repeat 2 times. Complete this exercise 3 times per week. STRENGTHENING EXERCISES These exercises build strength and endurance in your elbow and forearm. Endurance is the ability to use your muscles for a long time, even after they get tired. Exercise K: Elbow Flexion, Isometric  Stand or sit up straight. Bend your left / right elbow in an "L" shape (90 degrees) and turn your palm up so your forearm is at the height of your waist. Place your other hand on top of your forearm. Gently push down as your left / right arm resists. Push as hard as you can with both arms without causing any pain or movement at your left / right elbow. Hold this position for  3 seconds. Slowly release the tension in both arms. Let your muscles relax completely before repeating. Repeat 2 times. Complete this exercise 3 times per week. Exercise L: Elbow Extensors, Isometric  Stand or sit up straight. Place your left / right arm so your palm faces your abdomen and it is at the height of your waist. Place your other hand on the underside of your forearm. Gently push up as your left / right arm resists. Push as hard as you can with both arms, without causing any pain or movement at your left / right elbow. Hold this position for 3 seconds. Slowly release the tension in both arms. Let your muscles relax completely before repeating. Repeat _______2___ times. Complete this exercise 3 times per week. Exercise M: Elbow Flexion With Forearm Palm Up  Sit upright on a firm chair without armrests, or stand. Place your left / right arm at your side with your palm facing forward. Holding a 5 lbweight or gripping a rubber exercise band or tubing, bend your  elbow to bring your hand toward your shoulder. Hold this position for 3 seconds. Slowly return to the starting position. Repeat 2 times. Complete this exercise 3 times per week. Exercise N: Elbow Extension  Sit on a firm chair without armrests, or stand. Keeping your upper arms at your sides, bring both hands up toward your left / right shoulder while you grip a rubber exercise band or tubing. Your left / right hand should be just below the other hand. Straighten your left / right elbow. Hold this position for 3 seconds. Control the resistance of the band or tubing as your hand returns to your side. Repeat 2 times. Complete this exercise 3 times per week. Exercise O: Forearm Rotation, Supination  Sit with your left / right forearm supported on a table. Keep your elbow at waist height. Rest your hand over the edge of the table with your palm facing down. Gently hold a lightweight hammer. Without moving your elbow, slowly rotate your forearm to turn your palm and hand upward to a "thumbs-up" position. Hold this position for 3 seconds. Slowly return to the starting position. Repeat 2 times. Complete this exercise 3 times per week. Exercise P: Forearm Rotation, Pronation  Sit with your left / right forearm supported on a table. Keep your elbow below shoulder height. Rest your hand over the edge of the table with your palm facing up. Gently hold a lightweight hammer. Without moving your elbow, slowly rotate your forearm to turn your palm and hand upward to a "thumbs-up" position. Hold this position for 3 seconds. Slowly return to the starting position. Repeat 2 times. Complete this exercise 3 times per week.  Make sure you discuss any questions you have with your health care provider. Document Released: 06/13/2005 Document Revised: 12/08/2015 Document Reviewed: 04/24/2015 Elsevier Interactive Patient Education  2018 [employer]  Plantar Fasciitis Stretches/exercises Do exercises  exactly as told by your health care provider and adjust them as directed. It is normal to feel mild stretching, pulling, tightness, or discomfort as you do these exercises, but you should stop right away if you feel sudden pain or your pain gets worse.   Stretching and range of motion exercises These exercises warm up your muscles and joints and improve the movement and flexibility of your foot. These exercises also help to relieve pain.  Exercise A: Plantar fascia stretch Sit with your left / right leg crossed over your opposite knee. Hold your heel with one  hand with that thumb near your arch. With your other hand, hold your toes and gently pull them back toward the top of your foot. You should feel a stretch on the bottom of your toes or your foot or both. Hold this stretch for 30 seconds. Slowly release your toes and return to the starting position. Repeat 2 times. Complete this exercise 3 times per week.  Exercise B: Gastroc, standing Stand with your hands against a wall. Extend your left / right leg behind you, and bend your front knee slightly. Keeping your heels on the floor and keeping your back knee straight, shift your weight toward the wall without arching your back. You should feel a gentle stretch in your left / right calf. Hold this position for 30 seconds. Repeat 2 times. Complete this exercise 3 times a week. Exercise C: Soleus, standing Stand with your hands against a wall. Extend your left / right leg behind you, and bend your front knee slightly. Keeping your heels on the floor, bend your back knee and slightly shift your weight over the back leg. You should feel a gentle stretch deep in your calf. Hold this position for 30 seconds. Repeat 2 times. Complete this exercise 3 times per week. Exercise D: Gastrocsoleus, standing Stand with the ball of your left / right foot on a step. The ball of your foot is on the walking surface, right under your toes. Keep your other foot  firmly on the same step. Hold onto the wall or a railing for balance. Slowly lift your other foot, allowing your body weight to press your heel down over the edge of the step. You should feel a stretch in your left / right calf. Hold this position for 30 seconds. Return both feet to the step. Repeat this exercise with a slight bend in your left / right knee. Repeat 2 times with your left / right knee straight and 2times with your left / right knee bent. Complete this exercise 3 times a week.  Balance exercise This exercise builds your balance and strength control of your arch to help take pressure off your plantar fascia. Exercise E: Single leg stand Without shoes, stand near a railing or in a doorway. You may hold onto the railing or door frame as needed. Stand on your left / right foot. Keep your big toe down on the floor and try to keep your arch lifted. Do not let your foot roll inward. Hold this position for 30 seconds. If this exercise is too easy, you can try it with your eyes closed or while standing on a pillow. Repeat 2 times. Complete this exercise 3 times per week. This information is not intended to replace advice given to you by your health care provider. Make sure you discuss any questions you have with your health care provider. Document Released: 07/30/2005 Document Revised: 04/03/2016 Document Reviewed: 06/13/2015 Elsevier Interactive Patient Education  2017 [employer].

## 2021-06-07 NOTE — Progress Notes (Signed)
Chief Complaint  Patient presents with   Follow-up   Elbow Pain    Left arm    Subjective Roger Salinas is a 30 y.o. male who presents for hypertension follow up. He does monitor home blood pressures. Blood pressures ranging from 120-130's/80's on average. He is compliant with medications- Norvasc 5 mg/d, Diovan 160 mg/d. Patient has these side effects of medication: none He is adhering to a healthy diet overall. Current exercise: walking, active at work No CP or SOB.   L elbow pain Started 3-4 weeks. No inj or change in activity other than lifting things at work. Described at sharp, lateral elbow. No bruising, redness, swelling, numbness, tingling, weakness, decreased ROM.   PF PF is coming back on heel of L foot. He had an injection in the past that worked for years. He walks a lot. He does wear arch support.    Past Medical History:  Diagnosis Date   Essential hypertension 07/03/2017   Obstructive sleep apnea 06/03/2017    Exam BP (!) 138/92   Pulse 72   Temp 98 F (36.7 C) (Oral)   Ht 5\' 11"  (1.803 m)   Wt 233 lb 8 oz (105.9 kg)   SpO2 98%   BMI 32.57 kg/m  General:  well developed, well nourished, in no apparent distress Heart: RRR, no bruits, no LE edema Lungs: clear to auscultation, no accessory muscle use MSK: +TTP over L lateral epicondyle, +pain w resisted wrist extension on L; no edema, erythema, excessive warmth, deformity; ROM WNL Psych: well oriented with normal range of affect and appropriate judgment/insight  Essential hypertension - Plan: amLODipine-valsartan (EXFORGE) 10-160 MG tablet  Lateral epicondylitis of left elbow  Plantar fasciitis of left foot - Plan: diclofenac Sodium (VOLTAREN) 1 % GEL  Chronic, unstable. Change to Exforge 10-160 mg/d, leaving valsartan dosage alone and increasing amlodipine dosage to 10 mg/d. Counseled on diet and exercise. Monitor BP at home. If not controlled better, will return in 1 mo.  Ice, heat, Tylenol,  NSAIDs, stretches/exercises. Forearm strap. Consider wrist brace if no better. Consider injection.  Chronic, uncontrolled currently. Try NSAID gel. Not severe enough for injection yet. Stretches/exercises, ice.  F/u in 6 mo. The patient voiced understanding and agreement to the plan.  [place], DO 06/07/21  8:01 AM

## 2021-08-21 ENCOUNTER — Encounter: Payer: Self-pay | Admitting: Family Medicine

## 2021-08-21 DIAGNOSIS — S8992XA Unspecified injury of left lower leg, initial encounter: Secondary | ICD-10-CM | POA: Diagnosis not present

## 2021-09-02 ENCOUNTER — Encounter: Payer: Self-pay | Admitting: Family Medicine

## 2021-09-02 DIAGNOSIS — I10 Essential (primary) hypertension: Secondary | ICD-10-CM

## 2021-09-04 MED ORDER — AMLODIPINE BESYLATE-VALSARTAN 10-160 MG PO TABS: 1.0000 | ORAL_TABLET | Freq: Every day | ORAL | 2 refills | Status: DC

## 2021-12-08 ENCOUNTER — Ambulatory Visit (INDEPENDENT_AMBULATORY_CARE_PROVIDER_SITE_OTHER): Payer: BC Managed Care – PPO | Admitting: Family Medicine

## 2021-12-08 ENCOUNTER — Encounter: Payer: Self-pay | Admitting: Family Medicine

## 2021-12-08 VITALS — BP 112/80 | HR 73 | Temp 97.9°F | Ht 70.0 in | Wt 236.0 lb

## 2021-12-08 DIAGNOSIS — I10 Essential (primary) hypertension: Secondary | ICD-10-CM

## 2021-12-08 DIAGNOSIS — Z0000 Encounter for general adult medical examination without abnormal findings: Secondary | ICD-10-CM | POA: Diagnosis not present

## 2021-12-08 LAB — LIPID PANEL
Cholesterol: 165 mg/dL (ref 0–200)
HDL: 52 mg/dL (ref >39.00)
LDL Cholesterol: 100 mg/dL — ABNORMAL HIGH (ref 0–99)
NonHDL: 112.87
Total CHOL/HDL Ratio: 3
Triglycerides: 65 mg/dL (ref 0.0–149.0)
VLDL: 13 mg/dL (ref 0.0–40.0)

## 2021-12-08 LAB — COMPREHENSIVE METABOLIC PANEL
ALT: 16 U/L (ref 0–53)
AST: 17 U/L (ref 0–37)
Albumin: 4.4 g/dL (ref 3.5–5.2)
Alkaline Phosphatase: 110 U/L (ref 39–117)
BUN: 15 mg/dL (ref 6–23)
CO2: 31 mEq/L (ref 19–32)
Calcium: 9.5 mg/dL (ref 8.4–10.5)
Chloride: 102 mEq/L (ref 96–112)
Creatinine, Ser: 0.82 mg/dL (ref 0.40–1.50)
GFR: 115.92 mL/min (ref >60.00)
Glucose, Bld: 88 mg/dL (ref 70–99)
Potassium: 4.8 mEq/L (ref 3.5–5.1)
Sodium: 140 mEq/L (ref 135–145)
Total Bilirubin: 0.8 mg/dL (ref 0.2–1.2)
Total Protein: 6.8 g/dL (ref 6.0–8.3)

## 2021-12-08 LAB — CBC
HCT: 46 % (ref 39.0–52.0)
Hemoglobin: 15.6 g/dL (ref 13.0–17.0)
MCHC: 33.9 g/dL (ref 30.0–36.0)
MCV: 83.3 fl (ref 78.0–100.0)
Platelets: 201 10*3/uL (ref 150.0–400.0)
RBC: 5.52 Mil/uL (ref 4.22–5.81)
RDW: 13.6 % (ref 11.5–15.5)
WBC: 6.8 10*3/uL (ref 4.0–10.5)

## 2021-12-08 NOTE — Patient Instructions (Signed)
Give us 2-3 business days to get the results of your labs back.   Keep the diet clean and stay active.  Do monthly self testicular checks in the shower. You are feeling for lumps/bumps that don't belong. If you feel anything like this, let me know!  Please get me a copy of your advanced directive form at your convenience.   Let us know if you need anything.  

## 2021-12-08 NOTE — Progress Notes (Signed)
Chief Complaint  ?Patient presents with  ? Annual Exam  ? ? ?Well Male ?Roger Salinas is here for a complete physical.   ?His last physical was >1 year ago.  ?Current diet: in general, a "relatively fine" diet.   ?Current exercise: going to gym ?Weight trend: stable ?Fatigue out of ordinary? No. ?Seat belt? Yes.   ?Advanced directive? No ? ?Health maintenance ?Tetanus- Yes ?HIV- Yes ?Hep C- Yes ? ?Past Medical History:  ?Diagnosis Date  ? Essential hypertension 07/03/2017  ? Obstructive sleep apnea 06/03/2017  ?  ? ?Past Surgical History:  ?Procedure Laterality Date  ? TONSILLECTOMY    ? ? ?Medications  ?Current Outpatient Medications on File Prior to Visit  ?Medication Sig Dispense Refill  ? amLODipine-valsartan (EXFORGE) 10-160 MG tablet Take 1 tablet by mouth daily. 90 tablet 2  ? cholecalciferol (VITAMIN D) 1000 units tablet Take 1,000 Units by mouth 2 (two) times daily.    ? diclofenac Sodium (VOLTAREN) 1 % GEL Apply 2 g topically 4 (four) times daily. 100 g 2  ? Multiple Vitamin (MULTIVITAMIN) tablet Take 1 tablet by mouth daily.    ? ?Allergies ?Allergies  ?Allergen Reactions  ? Lisinopril Cough  ? ? ?Family History ?Family History  ?Problem Relation Age of Onset  ? Hypertension Mother   ? Diabetes Mother   ? Diabetes Maternal Uncle   ? Diabetes Maternal Grandmother   ? Diabetes Maternal Uncle   ? Heart attack Neg Hx   ? Hyperlipidemia Neg Hx   ? Sudden death Neg Hx   ? ? ?Review of Systems: ?Constitutional: no fevers or chills ?Eye:  no recent significant change in vision ?Ear/Nose/Mouth/Throat:  Ears:  no hearing loss ?Nose/Mouth/Throat:  no complaints of nasal congestion, no sore throat ?Cardiovascular:  no chest pain ?Respiratory:  no shortness of breath ?Gastrointestinal:  no abdominal pain, no change in bowel habits ?GU:  Male: negative for dysuria ?Musculoskeletal/Extremities:  no pain of the joints ?Integumentary (Skin/Breast):  no abnormal skin lesions reported ?Neurologic:  no  headaches ?Endocrine: No unexpected weight changes ?Hematologic/Lymphatic:  no night sweats ? ?Exam ?BP 112/80   Pulse 73   Temp 97.9 ?F (36.6 ?C) (Oral)   Ht 5\' 10"  (1.778 m)   Wt 236 lb (107 kg)   SpO2 97%   BMI 33.86 kg/m?  ?General:  well developed, well nourished, in no apparent distress ?Skin:  no significant moles, warts, or growths ?Head:  no masses, lesions, or tenderness ?Eyes:  pupils equal and round, sclera anicteric without injection ?Ears:  canals without lesions, TMs shiny without retraction, no obvious effusion, no erythema ?Nose:  nares patent, septum midline, mucosa normal ?Throat/Pharynx:  lips and gingiva without lesion; tongue and uvula midline; non-inflamed pharynx; no exudates or postnasal drainage ?Neck: neck supple without adenopathy, thyromegaly, or masses ?Lungs:  clear to auscultation, breath sounds equal bilaterally, no respiratory distress ?Cardio:  regular rate and rhythm, no bruits, no LE edema ?Abdomen:  abdomen soft, nontender; bowel sounds normal; no masses or organomegaly ?Genital (male): Deferred ?Rectal: Deferred ?Musculoskeletal:  symmetrical muscle groups noted without atrophy or deformity ?Extremities:  no clubbing, cyanosis, or edema, no deformities, no skin discoloration ?Neuro:  gait normal; deep tendon reflexes normal and symmetric ?Psych: well oriented with normal range of affect and appropriate judgment/insight ? ?Assessment and Plan ? ?Well adult exam - Plan: CBC, Comprehensive metabolic panel, Lipid panel ? ?Essential hypertension  ? ?Well 31 y.o. male. ?Counseled on diet and exercise. ?Self testicular exams recommended at  least monthly.  ?Advanced directive form provided today.  ?Other orders as above. ?Follow up in 1 year pending the above workup. ?The patient voiced understanding and agreement to the plan. ? ?Sharlene Dory, DO ?12/08/21 ?7:41 AM ? ?

## 2022-03-12 ENCOUNTER — Ambulatory Visit (INDEPENDENT_AMBULATORY_CARE_PROVIDER_SITE_OTHER): Payer: PRIVATE HEALTH INSURANCE | Admitting: Family Medicine

## 2022-03-12 ENCOUNTER — Encounter: Payer: Self-pay | Admitting: Family Medicine

## 2022-03-12 VITALS — BP 110/68 | HR 68 | Temp 98.1°F | Ht 71.0 in | Wt 243.1 lb

## 2022-03-12 DIAGNOSIS — L987 Excessive and redundant skin and subcutaneous tissue: Secondary | ICD-10-CM

## 2022-03-12 NOTE — Patient Instructions (Signed)
If you do not hear anything about your referral in the next 1-2 weeks, call our office and ask for an update.  Let us know if you need anything.  

## 2022-03-12 NOTE — Progress Notes (Signed)
Chief Complaint  Patient presents with   f   problems from weight loss.    Subjective: Patient is a 31 y.o. male here for excess skin from wt loss.  Patient has a history of bariatric surgery for which he lost around 150 pounds.  He has a lot of excess skin over the abdominal region.  He is not getting excess infections or having any pain.  His surgeon told him to follow-up with [place].  Past Medical History:  Diagnosis Date   Essential hypertension 07/03/2017   Obstructive sleep apnea 06/03/2017    Objective: BP 110/68   Pulse 68   Temp 98.1 F (36.7 C) (Oral)   Ht 5\' 11"  (1.803 m)   Wt 243 lb 2 oz (110.3 kg)   SpO2 96%   BMI 33.91 kg/m  General: Awake, appears stated age Skin: Lots of loose skin folds appreciated over the abdominal region without erythema, TTP, or noticeable drainage Lungs: No accessory muscle use Psych: Age appropriate judgment and insight, normal affect and mood  Assessment and Plan: Excess skin of abdomen - Plan: Ambulatory referral to Plastic Surgery  Refer to plastic surgery for their opinion.  We will have to go through their office to find out cost. The patient voiced understanding and agreement to the plan.  [place], DO 03/12/22  3:20 PM

## 2022-04-30 ENCOUNTER — Institutional Professional Consult (permissible substitution): Payer: PRIVATE HEALTH INSURANCE | Admitting: Plastic Surgery

## 2022-05-24 ENCOUNTER — Institutional Professional Consult (permissible substitution): Payer: PRIVATE HEALTH INSURANCE | Admitting: Plastic Surgery

## 2022-05-25 ENCOUNTER — Encounter: Payer: Self-pay | Admitting: Plastic Surgery

## 2022-05-25 ENCOUNTER — Ambulatory Visit (INDEPENDENT_AMBULATORY_CARE_PROVIDER_SITE_OTHER): Payer: PRIVATE HEALTH INSURANCE | Admitting: Plastic Surgery

## 2022-05-25 VITALS — BP 148/93 | HR 77 | Temp 97.8°F | Resp 16 | Ht 71.0 in | Wt 248.0 lb

## 2022-05-25 DIAGNOSIS — L987 Excessive and redundant skin and subcutaneous tissue: Secondary | ICD-10-CM | POA: Diagnosis not present

## 2022-05-25 DIAGNOSIS — R21 Rash and other nonspecific skin eruption: Secondary | ICD-10-CM

## 2022-05-25 DIAGNOSIS — Z9884 Bariatric surgery status: Secondary | ICD-10-CM | POA: Diagnosis not present

## 2022-05-25 NOTE — Progress Notes (Signed)
Referring Provider Shelda Pal, DO [address] [place],  [place] [address]   CC: No chief complaint on file.     Roger Salinas is an 31 y.o. male.  HPI: Roger Salinas is a 31 year old male who presents for management of excess skin on the anterior abdominal wall following Roux-en-Y gastric bypass surgery in 2019.  States that he has done well with his surgery and has lost approximately 150 pounds.  He is currently stable at his goal weight.  He has a large amount of skin on the anterior abdominal wall which makes which makes it difficult for him to wear his close and interferes with his work.  During the consult he stated that he uses nystatin cream on the posterior aspect of his pannus on a regular basis however he has neglected to discuss this with his primary care physician in the past.  Allergies  Allergen Reactions   Lisinopril Cough    Outpatient Encounter Medications as of 05/25/2022  Medication Sig   amLODipine-valsartan (EXFORGE) 10-160 MG tablet Take 1 tablet by mouth daily.   cholecalciferol (VITAMIN D) 1000 units tablet Take 1,000 Units by mouth 2 (two) times daily.   Multiple Vitamin (MULTIVITAMIN) tablet Take 1 tablet by mouth daily.   diclofenac Sodium (VOLTAREN) 1 % GEL Apply 2 g topically 4 (four) times daily. (Patient not taking: Reported on 05/25/2022)   No facility-administered encounter medications on file as of 05/25/2022.     Past Medical History:  Diagnosis Date   Essential hypertension 07/03/2017   Obstructive sleep apnea 06/03/2017    Past Surgical History:  Procedure Laterality Date   TONSILLECTOMY      Family History  Problem Relation Age of Onset   Hypertension Mother    Diabetes Mother    Diabetes Maternal Uncle    Diabetes Maternal Grandmother    Diabetes Maternal Uncle    Heart attack Neg Hx    Hyperlipidemia Neg Hx    Sudden death Neg Hx     Social History   Social History Narrative   Not on file      Review of Systems General: Denies fevers, chills, weight loss CV: Denies chest pain, shortness of breath, palpitations Skin: Large amount of excess skin on the anterior abdominal wall.  Intermittent rashes underneath the anterior abdominal wall skin.  Physical Exam    05/25/2022    9:29 AM 05/25/2022    9:24 AM 03/12/2022    3:10 PM  Vitals with BMI  Height  5\' 11"  5\' 11"   Weight  248 lbs 243 lbs 2 oz  BMI  41.7 40.81  Systolic 448  185  Diastolic 93  68  Pulse 77  68    General:  No acute distress,  Alert and oriented, Non-Toxic, Normal speech and affect BMI 34.5 Abdomen: Soft nontender multiple well-healed incisions on the anterior abdominal wall.  Significant anterior abdominal wall excess skin and fat. Integument: There are rashes on the central portion of the posterior aspect of the pannus and on the lower abdominal wall.  These were photographed for documentation purposes.  Assessment/Plan Post bariatric surgery, excess anterior abdominal wall skin: Patient has had excellent result from his bariatric surgery but now has a large amount of anterior abdominal wall skin with very little fat.  Would be an excellent candidate for panniculectomy or panniculectomy with abdominoplasty.  I discussed both procedures with him.  He understands that the panniculectomy is the only thing that would  be covered by insurance and any additional procedures would be cosmetic in nature.  We discussed all of the procedures at length including the location of the scars the need for drains postoperatively the need for compression postoperatively the risks of bleeding, infection, seroma.  Additionally we discussed the fact that the flanks would not be addressed with either panniculectomy or abdominoplasty.  It is possible to add a midline the incision to more thoroughly contour the flanks.  He will consider whether he would like to add this at the time of surgery and can inform me on the morning of surgery.   Photographs were obtained and the proper paperwork will be sent for insurance consideration.  Santiago Glad 05/25/2022, 10:11 AM

## 2022-06-12 ENCOUNTER — Ambulatory Visit (INDEPENDENT_AMBULATORY_CARE_PROVIDER_SITE_OTHER): Payer: PRIVATE HEALTH INSURANCE | Admitting: Family Medicine

## 2022-06-12 ENCOUNTER — Encounter: Payer: Self-pay | Admitting: Family Medicine

## 2022-06-12 ENCOUNTER — Encounter: Payer: Self-pay | Admitting: *Deleted

## 2022-06-12 VITALS — BP 122/84 | HR 76 | Temp 98.0°F | Ht 70.0 in | Wt 253.0 lb

## 2022-06-12 DIAGNOSIS — Z3009 Encounter for other general counseling and advice on contraception: Secondary | ICD-10-CM | POA: Diagnosis not present

## 2022-06-12 DIAGNOSIS — I10 Essential (primary) hypertension: Secondary | ICD-10-CM

## 2022-06-12 MED ORDER — AMLODIPINE BESYLATE-VALSARTAN 10-160 MG PO TABS: 1.0000 | ORAL_TABLET | Freq: Every day | ORAL | 2 refills | Status: DC

## 2022-06-12 NOTE — Patient Instructions (Signed)
Give us 2-3 business days to get the results of your labs back.   Keep the diet clean and stay active.  Do monthly self testicular checks in the shower. You are feeling for lumps/bumps that don't belong. If you feel anything like this, let me know!  Please get me a copy of your advanced directive form at your convenience.   Let us know if you need anything.  

## 2022-06-12 NOTE — Addendum Note (Signed)
Addended by: Ames Coupe on: 06/12/2022 07:47 AM   Modules accepted: Orders, Level of Service

## 2022-06-12 NOTE — Progress Notes (Addendum)
Chief Complaint  Patient presents with   Follow-up    Subjective Roger Salinas is a 31 y.o. male who presents for hypertension follow up. He does not monitor home blood pressures. He is compliant with medications- Exforge 10-160 mg/d. Patient has these side effects of medication: none Diet has been better due to son having baseball.   Current exercise: lifting wts, cardio No CP or SOB.    Past Medical History:  Diagnosis Date   Essential hypertension 07/03/2017   Obstructive sleep apnea 06/03/2017    Exam BP 122/84 (BP Location: Left Arm, Patient Position: Sitting, Cuff Size: Normal)   Pulse 76   Temp 98 F (36.7 C) (Oral)   Ht 5\' 10"  (1.778 m)   Wt 253 lb (114.8 kg)   SpO2 98%   BMI 36.30 kg/m  General:  well developed, well nourished, in no apparent distress Heart: RRR, no bruits, no LE edema Lungs: clear to auscultation, no accessory muscle use Psych: well oriented with normal range of affect and appropriate judgment/insight  Essential hypertension - Plan: amLODipine-valsartan (EXFORGE) 10-160 MG tablet  Vasectomy evaluation - Plan: Ambulatory referral to Urology  Chronic, stable Cont Exforge 10-160 mg/d. Counseled on diet and exercise. F/u in 6 mo for CPE. The patient voiced understanding and agreement to the plan.  [place], DO 06/12/22  7:47 AM

## 2022-07-03 ENCOUNTER — Telehealth: Payer: Self-pay

## 2022-07-03 NOTE — Telephone Encounter (Signed)
Faxed notes, and photos to [facility]. Ref# [id number]

## 2022-07-11 ENCOUNTER — Telehealth: Payer: Self-pay

## 2022-07-11 NOTE — Telephone Encounter (Signed)
Emailed photos that were not clearly received by [employer].  Sent via email to [email or web address]

## 2022-07-16 ENCOUNTER — Telehealth: Payer: Self-pay

## 2022-07-16 NOTE — Telephone Encounter (Signed)
Called patient and advised PA has gone to Medical Review for further reviewing.

## 2022-07-24 ENCOUNTER — Telehealth: Payer: Self-pay

## 2022-07-24 NOTE — Telephone Encounter (Signed)
Called patient, advised insurance denied panniculectomy due to no records/notes from [facility] or a dermatologist regarding treatments from rashes/skin irritaiton caused by having excessive skin.  He did mentioned when he called the doctors office, they advised him to buy OTC and nothing with documented in his notes/chart. Suggest to schedule a visit every time he gets a rash as well as send photos via MyChart to help [place] build a case and we can appeal.  Patient understood and agreed with plan

## 2022-09-17 ENCOUNTER — Encounter: Payer: Self-pay | Admitting: Family Medicine

## 2022-09-17 ENCOUNTER — Ambulatory Visit (INDEPENDENT_AMBULATORY_CARE_PROVIDER_SITE_OTHER): Payer: PRIVATE HEALTH INSURANCE | Admitting: Family Medicine

## 2022-09-17 VITALS — BP 121/82 | HR 80 | Temp 98.0°F | Ht 70.0 in | Wt 253.0 lb

## 2022-09-17 DIAGNOSIS — N4889 Other specified disorders of penis: Secondary | ICD-10-CM | POA: Diagnosis not present

## 2022-09-17 DIAGNOSIS — R310 Gross hematuria: Secondary | ICD-10-CM

## 2022-09-17 LAB — BASIC METABOLIC PANEL
BUN: 14 mg/dL (ref 6–23)
CO2: 29 mEq/L (ref 19–32)
Calcium: 9.3 mg/dL (ref 8.4–10.5)
Chloride: 102 mEq/L (ref 96–112)
Creatinine, Ser: 0.79 mg/dL (ref 0.40–1.50)
GFR: 116.59 mL/min (ref >60.00)
Glucose, Bld: 108 mg/dL — ABNORMAL HIGH (ref 70–99)
Potassium: 4.5 mEq/L (ref 3.5–5.1)
Sodium: 139 mEq/L (ref 135–145)

## 2022-09-17 LAB — URINALYSIS, MICROSCOPIC ONLY

## 2022-09-17 NOTE — Patient Instructions (Addendum)
Please call Dr. Venia Minks and team: [phone]   Give [place] 2-3 business days to get the results of your labs back.   Stay hydrated.  OK to take Tylenol 1000 mg (2 extra strength tabs) or 975 mg (3 regular strength tabs) every 6 hours as needed.  Ibuprofen 400-600 mg (2-3 over the counter strength tabs) every 6 hours as needed for pain.  Let me know if you need anything stronger for pain.   Let us know if you need anything.

## 2022-09-17 NOTE — Progress Notes (Signed)
Chief Complaint  Patient presents with   Follow-up    Urgent Care.  Seen for painful urination    Subjective: Patient is a 32 y.o. male here for f/u.  4 days ago, the patient started having pain with urination.  The next day he went to urgent care where a urinalysis showed some blood and culture was unremarkable.  He was placed on Flomax which has not helped.  He has noticed some blood in his urine in addition to having an episode of urinary incontinence.  It hurts at the base of his penis.  He had intercourse and while he climax, did not ejaculate anything.  This is atypical for him.  No trauma or discharge.  No new sexual partners.  Past Medical History:  Diagnosis Date   Essential hypertension 07/03/2017   Obstructive sleep apnea 06/03/2017    Objective: BP 121/82 (BP Location: Left Arm, Patient Position: Sitting, Cuff Size: Normal)   Pulse 80   Temp 98 F (36.7 C) (Oral)   Ht 5\' 10"  (1.778 m)   Wt 253 lb (114.8 kg)   SpO2 97%   BMI 36.30 kg/m  General: Awake, appears stated age Heart: RRR Abdomen: Bowel sounds present, soft, nontender, nondistended GU: No external lesions noted, mild tenderness at the base of the shaft to palpation, no deformity noted Lungs: CTAB, no rales, wheezes or rhonchi. No accessory muscle use Psych: Age appropriate judgment and insight, normal affect and mood  Assessment and Plan: Gross hematuria - Plan: Basic metabolic panel, Urine Microscopic Only  Penile pain - Plan: Basic metabolic panel  Check renal function and urine microscopy today.  Negative culture appreciated from urgent care.  We will also have him see his urologist hopefully this week.  Tylenol/ibuprofen for pain.  He will let me know if he needs anything stronger. The patient voiced understanding and agreement to the plan.  [place], DO 09/17/22  11:47 AM

## 2022-09-19 ENCOUNTER — Encounter: Payer: Self-pay | Admitting: Family Medicine

## 2022-12-17 ENCOUNTER — Encounter: Payer: Self-pay | Admitting: Family Medicine

## 2022-12-17 ENCOUNTER — Ambulatory Visit (INDEPENDENT_AMBULATORY_CARE_PROVIDER_SITE_OTHER): Payer: PRIVATE HEALTH INSURANCE | Admitting: Family Medicine

## 2022-12-17 VITALS — BP 128/80 | HR 75 | Temp 98.0°F | Ht 71.0 in | Wt 259.1 lb

## 2022-12-17 DIAGNOSIS — I10 Essential (primary) hypertension: Secondary | ICD-10-CM

## 2022-12-17 DIAGNOSIS — Z0000 Encounter for general adult medical examination without abnormal findings: Secondary | ICD-10-CM | POA: Diagnosis not present

## 2022-12-17 LAB — COMPREHENSIVE METABOLIC PANEL
ALT: 17 U/L (ref 0–53)
AST: 14 U/L (ref 0–37)
Albumin: 4.2 g/dL (ref 3.5–5.2)
Alkaline Phosphatase: 104 U/L (ref 39–117)
BUN: 16 mg/dL (ref 6–23)
CO2: 30 mEq/L (ref 19–32)
Calcium: 9.4 mg/dL (ref 8.4–10.5)
Chloride: 103 mEq/L (ref 96–112)
Creatinine, Ser: 0.92 mg/dL (ref 0.40–1.50)
GFR: 108.99 mL/min (ref >60.00)
Glucose, Bld: 97 mg/dL (ref 70–99)
Potassium: 4.7 mEq/L (ref 3.5–5.1)
Sodium: 141 mEq/L (ref 135–145)
Total Bilirubin: 0.6 mg/dL (ref 0.2–1.2)
Total Protein: 6.9 g/dL (ref 6.0–8.3)

## 2022-12-17 LAB — LIPID PANEL
Cholesterol: 161 mg/dL (ref 0–200)
HDL: 50.7 mg/dL (ref >39.00)
LDL Cholesterol: 94 mg/dL (ref 0–99)
NonHDL: 110.38
Total CHOL/HDL Ratio: 3
Triglycerides: 80 mg/dL (ref 0.0–149.0)
VLDL: 16 mg/dL (ref 0.0–40.0)

## 2022-12-17 LAB — CBC
HCT: 46.5 % (ref 39.0–52.0)
Hemoglobin: 16.2 g/dL (ref 13.0–17.0)
MCHC: 34.8 g/dL (ref 30.0–36.0)
MCV: 82.2 fl (ref 78.0–100.0)
Platelets: 195 10*3/uL (ref 150.0–400.0)
RBC: 5.66 Mil/uL (ref 4.22–5.81)
RDW: 12.9 % (ref 11.5–15.5)
WBC: 6 10*3/uL (ref 4.0–10.5)

## 2022-12-17 MED ORDER — AMLODIPINE BESYLATE-VALSARTAN 10-160 MG PO TABS: 1.0000 | ORAL_TABLET | Freq: Every day | ORAL | 3 refills | Status: DC

## 2022-12-17 NOTE — Patient Instructions (Addendum)
Give [place] 2-3 business days to get the results of your labs back.   Keep the diet clean and stay active.  Aim to do some physical exertion for 150 minutes per week. This is typically divided into 5 days per week, 30 minutes per day. The activity should be enough to get your heart rate up. Anything is better than nothing if you have time constraints.  Do monthly self testicular checks in the shower. You are feeling for lumps/bumps that don't belong. If you feel anything like this, let me know!  Please get me a copy of your advanced directive form at your convenience.   OK to use Debrox (peroxide) in the ear to loosen up wax. Also recommend using a bulb syringe (for removing boogers from baby's noses) to flush through warm water and vinegar (3-4:1 ratio). An alternative, though more expensive, is an elephant ear washer wax removal kit. Do not use Q-tips as this can impact wax further.  Let us know if you need anything.

## 2022-12-17 NOTE — Progress Notes (Signed)
Chief Complaint  Patient presents with   Annual Exam    Well Male Roger Salinas is here for a complete physical.   His last physical was >1 year ago.  Current diet: in general, a "so-so" diet.   Current exercise: calisthenics Weight trend: up a few lbs Fatigue out of ordinary? No. Seat belt? Yes.   Advanced directive? No  Health maintenance Tetanus- Yes HIV- Yes Hep C- Yes  Past Medical History:  Diagnosis Date   Essential hypertension 07/03/2017   Obstructive sleep apnea 06/03/2017    Past Surgical History:  Procedure Laterality Date   TONSILLECTOMY     Medications  Current Outpatient Medications on File Prior to Visit  Medication Sig Dispense Refill   amLODipine-valsartan (EXFORGE) 10-160 MG tablet Take 1 tablet by mouth daily. 90 tablet 2   cholecalciferol (VITAMIN D) 1000 units tablet Take 1,000 Units by mouth 2 (two) times daily.     Multiple Vitamin (MULTIVITAMIN) tablet Take 1 tablet by mouth daily.     Allergies Allergies  Allergen Reactions   Lisinopril Cough   Family History Family History  Problem Relation Age of Onset   Hypertension Mother    Diabetes Mother    Diabetes Maternal Uncle    Diabetes Maternal Grandmother    Diabetes Maternal Uncle    Heart attack Neg Hx    Hyperlipidemia Neg Hx    Sudden death Neg Hx     Review of Systems: Constitutional: no fevers or chills Eye:  no recent significant change in vision Ear/Nose/Mouth/Throat:  Ears:  no hearing loss Nose/Mouth/Throat:  no complaints of nasal congestion, no sore throat Cardiovascular:  no chest pain Respiratory:  no shortness of breath Gastrointestinal:  no abdominal pain, no change in bowel habits GU:  Male: negative for dysuria Musculoskeletal/Extremities:  no pain of the joints Integumentary (Skin/Breast):  no abnormal skin lesions reported Neurologic:  no headaches Endocrine: No unexpected weight changes Hematologic/Lymphatic:  no night sweats  Exam BP 128/80 (BP  Location: Left Arm, Patient Position: Sitting, Cuff Size: Normal)   Pulse 75   Temp 98 F (36.7 C) (Oral)   Ht 5\' 11"  (1.803 m)   Wt 259 lb 2 oz (117.5 kg)   SpO2 99%   BMI 36.14 kg/m  General:  well developed, well nourished, in no apparent distress Skin:  no significant moles, warts, or growths Head:  no masses, lesions, or tenderness Eyes:  pupils equal and round, sclera anicteric without injection Ears:  canals without lesions, TMs shiny without retraction, no obvious effusion, no erythema Nose:  nares patent, mucosa normal Throat/Pharynx:  lips and gingiva without lesion; tongue and uvula midline; non-inflamed pharynx; no exudates or postnasal drainage Neck: neck supple without adenopathy, thyromegaly, or masses Lungs:  clear to auscultation, breath sounds equal bilaterally, no respiratory distress Cardio:  regular rate and rhythm, no bruits, no LE edema Abdomen:  abdomen soft, nontender; bowel sounds normal; no masses or organomegaly Genital (male): Deferred Rectal: Deferred Musculoskeletal:  symmetrical muscle groups noted without atrophy or deformity Extremities:  no clubbing, cyanosis, or edema, no deformities, no skin discoloration Neuro:  gait normal; deep tendon reflexes normal and symmetric Psych: well oriented with normal range of affect and appropriate judgment/insight  Assessment and Plan  Well adult exam - Plan: CBC, Comprehensive metabolic panel, Lipid panel   Well 32 y.o. male. Counseled on diet and exercise. Self testicular exams recommended at least monthly.  Advanced directive form provided today.  Other orders as above. Follow up  in 6 mo pending the above workup. The patient voiced understanding and agreement to the plan.  [name] [place], DO 12/17/22 7:21 AM

## 2023-05-09 ENCOUNTER — Encounter: Payer: Self-pay | Admitting: Physician Assistant

## 2023-05-09 ENCOUNTER — Ambulatory Visit: Payer: PRIVATE HEALTH INSURANCE | Admitting: Physician Assistant

## 2023-05-09 VITALS — BP 155/98 | HR 68 | Temp 98.1°F | Resp 16 | Ht 71.0 in | Wt 261.5 lb

## 2023-05-09 DIAGNOSIS — J0100 Acute maxillary sinusitis, unspecified: Secondary | ICD-10-CM | POA: Diagnosis not present

## 2023-05-09 MED ORDER — AZELASTINE HCL 0.1 % NA SOLN: 1.0000 | Freq: Two times a day (BID) | NASAL | 1 refills | Status: DC

## 2023-05-09 MED ORDER — AMOXICILLIN 875 MG PO TABS: 875.0000 mg | ORAL_TABLET | Freq: Two times a day (BID) | ORAL | 0 refills | Status: AC

## 2023-05-09 NOTE — Progress Notes (Signed)
Established patient visit   Patient: Roger Salinas   DOB: 08-10-1991   32 y.o. Male  MRN: [id number] Visit Date: 05/09/2023  Today's healthcare provider: Alfredia Ferguson, PA-C   Chief Complaint  Patient presents with   Sinusitis    X1 week, Facial pressure around eyes mainly at night, mild cough at night, denies sore throat, ear pain, fever   Subjective    Sinusitis Associated symptoms include congestion, coughing, headaches and sinus pressure. Pertinent negatives include no shortness of breath.   Pt reports nasal congestion, headache, PND x 1 week, went away for a few days, and now worse. Mild cough at night. Not taking anything over the counter. Denies fevers.   Medications: Outpatient Medications Prior to Visit  Medication Sig   amLODipine-valsartan (EXFORGE) 10-160 MG tablet Take 1 tablet by mouth daily.   cholecalciferol (VITAMIN D) 1000 units tablet Take 1,000 Units by mouth 2 (two) times daily.   Multiple Vitamin (MULTIVITAMIN) tablet Take 1 tablet by mouth daily.   No facility-administered medications prior to visit.    Review of Systems  Constitutional:  Positive for fatigue. Negative for fever.  HENT:  Positive for congestion, postnasal drip, rhinorrhea, sinus pressure and sinus pain.   Respiratory:  Positive for cough. Negative for shortness of breath.   Cardiovascular:  Negative for chest pain, palpitations and leg swelling.  Neurological:  Positive for headaches. Negative for dizziness.      Objective    BP (!) 155/98   Pulse 68   Temp 98.1 F (36.7 C) (Oral)   Resp 16   Ht 5\' 11"  (1.803 m)   Wt 261 lb 8 oz (118.6 kg)   SpO2 97%   BMI 36.47 kg/m   Physical Exam Constitutional:      General: He is awake.     Appearance: He is well-developed.  HENT:     Head: Normocephalic.     Right Ear: Tympanic membrane normal.     Left Ear: Tympanic membrane normal.     Nose: Congestion and rhinorrhea present.     Mouth/Throat:     Pharynx: No  posterior oropharyngeal erythema.  Eyes:     Conjunctiva/sclera: Conjunctivae normal.  Cardiovascular:     Rate and Rhythm: Normal rate and regular rhythm.     Heart sounds: Normal heart sounds.  Pulmonary:     Effort: Pulmonary effort is normal.     Breath sounds: Normal breath sounds.  Skin:    General: Skin is warm.  Neurological:     Mental Status: He is alert and oriented to person, place, and time.  Psychiatric:        Attention and Perception: Attention normal.        Mood and Affect: Mood normal.        Speech: Speech normal.        Behavior: Behavior is cooperative.      No results found for any visits on 05/09/23.  Assessment & Plan     1. Acute non-recurrent maxillary sinusitis Hydrate, tylneol, rx amox bid x 7 days  Rx azelastine nasal spray  - amoxicillin (AMOXIL) 875 MG tablet; Take 1 tablet (875 mg total) by mouth 2 (two) times daily for 7 days.  Dispense: 14 tablet; Refill: 0 - azelastine (ASTELIN) 0.1 % nasal spray; Place 1 spray into both nostrils 2 (two) times daily. Use in each nostril as directed  Dispense: 30 mL; Refill: 1   Return if symptoms worsen or fail  to improve.      I, Alfredia Ferguson, PA-C have reviewed all documentation for this visit. The documentation on  05/09/23   for the exam, diagnosis, procedures, and orders are all accurate and complete.    Alfredia Ferguson, PA-C  [facility] [phone] (phone) [phone] (fax)  [facility] +

## 2023-06-19 ENCOUNTER — Encounter: Payer: Self-pay | Admitting: Family Medicine

## 2023-06-19 ENCOUNTER — Ambulatory Visit: Payer: PRIVATE HEALTH INSURANCE | Admitting: Family Medicine

## 2023-06-19 VITALS — BP 124/78 | HR 73 | Temp 98.0°F | Resp 16 | Ht 71.0 in | Wt 264.2 lb

## 2023-06-19 DIAGNOSIS — S46811A Strain of other muscles, fascia and tendons at shoulder and upper arm level, right arm, initial encounter: Secondary | ICD-10-CM | POA: Diagnosis not present

## 2023-06-19 DIAGNOSIS — I10 Essential (primary) hypertension: Secondary | ICD-10-CM | POA: Diagnosis not present

## 2023-06-19 NOTE — Progress Notes (Signed)
Chief Complaint  Patient presents with   Follow-up    Subjective Roger Salinas is a 32 y.o. male who presents for hypertension follow up. He does monitor home blood pressures. Blood pressures ranging from 120's/70's on average. He is no longer taking medication, was on Exforge 10-160 mg/d. Seems to be more irritable while taking it. Over past 2 weeks, feels his mood has been improving.  He is sometimes adhering to a healthy diet overall. Current exercise: walking No CP or SOB.   Last week, the patient was reaching for something.  He felt a pull in his right shoulder area.  He has been having some pain in that area since.  He reached for something again a few days ago and felt a pop.  No bruising, redness, or swelling.  He has not lost any range of motion.  He is not sure anything at home so far.  No neurologic signs or symptoms.   Past Medical History:  Diagnosis Date   Essential hypertension 07/03/2017   Obstructive sleep apnea 06/03/2017    Exam BP 124/78 (BP Location: Left Arm, Patient Position: Sitting, Cuff Size: Normal)   Pulse 73   Temp 98 F (36.7 C) (Oral)   Resp 16   Ht 5\' 11"  (1.803 m)   Wt 264 lb 3.2 oz (119.8 kg)   SpO2 96%   BMI 36.85 kg/m  General:  well developed, well nourished, in no apparent distress Heart: RRR, no bruits, no LE edema Lungs: clear to auscultation, no accessory muscle use MSK: TTP over the right trapezius musculature.  No erythema, ecchymosis, edema, deformity.  Negative Spurling's, Neer's, crossover, speeds, empty can.  Normal active and passive range of motion. Psych: well oriented with normal range of affect and appropriate judgment/insight  Essential hypertension  Strain of right trapezius muscle, initial encounter  Chronic, stable. He is stable off of med. Will DC Exforge, monitor BP at home. Let me know if changes.  After that, if still normal he will stop.  Counseled on diet and exercise. Acute and likely self resolving issue.   Heat, ice, Tylenol, stretches and exercises.  He will send a message if no improvement in the next several weeks. F/u in 6 mo. The patient voiced understanding and agreement to the plan.  [name] [place], DO 06/19/23  7:26 AM

## 2023-06-19 NOTE — Patient Instructions (Addendum)
Keep the diet clean and stay active.  Aim to do some physical exertion for 150 minutes per week. This is typically divided into 5 days per week, 30 minutes per day. The activity should be enough to get your heart rate up. Anything is better than nothing if you have time constraints.  Please consider adding some weight resistance exercise to your routine. Consider yoga as well.   Monitor blood pressures at home 2-4 times per week over the next 3 weeks. If staying low, we can stop. If creeping up again, let me know.  I want your blood pressure less than 140 on the top and less than 90 on the bottom consistently. Both goals must be met (ie, 150/70 is too high even though the 70 on the bottom is desirable).   Heat (pad or rice pillow in microwave) over affected area, 10-15 minutes twice daily.   Ice/cold pack over area for 10-15 min twice daily.  OK to take Tylenol 1000 mg (2 extra strength tabs) or 975 mg (3 regular strength tabs) every 6 hours as needed.  Let us know if you need anything.  Trapezius stretches/exercises Do exercises exactly as told by your health care provider and adjust them as directed. It is normal to feel mild stretching, pulling, tightness, or discomfort as you do these exercises, but you should stop right away if you feel sudden pain or your pain gets worse.   Stretching and range of motion exercises These exercises warm up your muscles and joints and improve the movement and flexibility of your shoulder. These exercises can also help to relieve pain, numbness, and tingling. If you are unable to do any of the following for any reason, do not further attempt to do it.   Exercise A: Flexion, standing     Stand and hold a broomstick, a cane, or a similar object. Place your hands a little more than shoulder-width apart on the object. Your left / right hand should be palm-up, and your other hand should be palm-down. Push the stick to raise your left / right arm out to your  side and then over your head. Use your other hand to help move the stick. Stop when you feel a stretch in your shoulder, or when you reach the angle that is recommended by your health care provider. Avoid shrugging your shoulder while you raise your arm. Keep your shoulder blade tucked down toward your spine. Hold for 30 seconds. Slowly return to the starting position. Repeat 2 times. Complete this exercise 3 times per week.  Exercise B: Abduction, supine     Lie on your back and hold a broomstick, a cane, or a similar object. Place your hands a little more than shoulder-width apart on the object. Your left / right hand should be palm-up, and your other hand should be palm-down. Push the stick to raise your left / right arm out to your side and then over your head. Use your other hand to help move the stick. Stop when you feel a stretch in your shoulder, or when you reach the angle that is recommended by your health care provider. Avoid shrugging your shoulder while you raise your arm. Keep your shoulder blade tucked down toward your spine. Hold for 30 seconds. Slowly return to the starting position. Repeat 2 times. Complete this exercise 3 times per week.  Exercise C: Flexion, active-assisted     Lie on your back. You may bend your knees for comfort. Hold a broomstick,  a cane, or a similar object. Place your hands about shoulder-width apart on the object. Your palms should face toward your feet. Raise the stick and move your arms over your head and behind your head, toward the floor. Use your healthy arm to help your left / right arm move farther. Stop when you feel a gentle stretch in your shoulder, or when you reach the angle where your health care provider tells you to stop. Hold for 30 seconds. Slowly return to the starting position. Repeat 2 times. Complete this exercise 3 times per week.  Exercise D: External rotation and abduction     Stand in a door frame with one of your feet  slightly in front of the other. This is called a staggered stance. Choose one of the following positions as told by your health care provider: Place your hands and forearms on the door frame above your head. Place your hands and forearms on the door frame at the height of your head. Place your hands on the door frame at the height of your elbows. Slowly move your weight onto your front foot until you feel a stretch across your chest and in the front of your shoulders. Keep your head and chest upright and keep your abdominal muscles tight. Hold for 30 seconds. To release the stretch, shift your weight to your back foot. Repeat 2 times. Complete this stretch 3 times per week.  Strengthening exercises These exercises build strength and endurance in your shoulder. Endurance is the ability to use your muscles for a long time, even after your muscles get tired. Exercise E: Scapular depression and adduction  Sit on a stable chair. Support your arms in front of you with pillows, armrests, or a tabletop. Keep your elbows in line with the sides of your body. Gently move your shoulder blades down toward your middle back. Relax the muscles on the tops of your shoulders and in the back of your neck. Hold for 3 seconds. Slowly release the tension and relax your muscles completely before doing this exercise again. Repeat for a total of 10 repetitions. After you have practiced this exercise, try doing the exercise without the arm support. Then, try the exercise while standing instead of sitting. Repeat 2 times. Complete this exercise 3 times per week.  Exercise F: Shoulder abduction, isometric     Stand or sit about 4-6 inches (10-15 cm) from a wall with your left / right side facing the wall. Bend your left / right elbow and gently press your elbow against the wall. Increase the pressure slowly until you are pressing as hard as you can without shrugging your shoulder. Hold for 3 seconds. Slowly release  the tension and relax your muscles completely. Repeat for a total of 10 repetitions. Repeat 2 times. Complete this exercise 3 times per week.  Exercise G: Shoulder flexion, isometric     Stand or sit about 4-6 inches (10-15 cm) away from a wall with your left / right side facing the wall. Keep your left / right elbow straight and gently press the top of your fist against the wall. Increase the pressure slowly until you are pressing as hard as you can without shrugging your shoulder. Hold for 10-15 seconds. Slowly release the tension and relax your muscles completely. Repeat for a total of 10 repetitions. Repeat 2 times. Complete this exercise 3 times per week.  Exercise H: Internal rotation     Sit in a stable chair without armrests, or stand.  Secure an exercise band at your left / right side, at elbow height. Place a soft object, such as a folded towel or a small pillow, under your left / right upper arm so your elbow is a few inches (about 8 cm) away from your side. Hold the end of the exercise band so the band stretches. Keeping your elbow pressed against the soft object under your arm, move your forearm across your body toward your abdomen. Keep your body steady so the movement is only coming from your shoulder. Hold for 3 seconds. Slowly return to the starting position. Repeat for a total of 10 repetitions. Repeat 2 times. Complete this exercise 3 times per week.  Exercise I: External rotation     Sit in a stable chair without armrests, or stand. Secure an exercise band at your left / right side, at elbow height. Place a soft object, such as a folded towel or a small pillow, under your left / right upper arm so your elbow is a few inches (about 8 cm) away from your side. Hold the end of the exercise band so the band stretches. Keeping your elbow pressed against the soft object under your arm, move your forearm out, away from your abdomen. Keep your body steady so the movement is  only coming from your shoulder. Hold for 3 seconds. Slowly return to the starting position. Repeat for a total of 10 repetitions. Repeat 2 times. Complete this exercise 3 times per week. Exercise J: Shoulder extension  Sit in a stable chair without armrests, or stand. Secure an exercise band to a stable object in front of you so the band is at shoulder height. Hold one end of the exercise band in each hand. Your palms should face each other. Straighten your elbows and lift your hands up to shoulder height. Step back, away from the secured end of the exercise band, until the band stretches. Squeeze your shoulder blades together and pull your hands down to the sides of your thighs. Stop when your hands are straight down by your sides. Do not let your hands go behind your body. Hold for 3 seconds. Slowly return to the starting position. Repeat for a total of 10 repetitions. Repeat 2 times. Complete this exercise 3 times per week.  Exercise K: Shoulder extension, prone     Lie on your abdomen on a firm surface so your left / right arm hangs over the edge. Hold a 5 lb weight in your hand so your palm faces in toward your body. Your arm should be straight. Squeeze your shoulder blade down toward the middle of your back. Slowly raise your arm behind you, up to the height of the surface that you are lying on. Keep your arm straight. Hold for 3 seconds. Slowly return to the starting position and relax your muscles. Repeat for a total of 10 repetitions. Repeat 2 times. Complete this exercise 3 times per week.   Exercise L: Horizontal abduction, prone  Lie on your abdomen on a firm surface so your left / right arm hangs over the edge. Hold a 5 lb weight in your hand so your palm faces toward your feet. Your arm should be straight. Squeeze your shoulder blade down toward the middle of your back. Bend your elbow so your hand moves up, until your elbow is bent to an "L" shape (90 degrees). With your  elbow bent, slowly move your forearm forward and up. Raise your hand up to the height of  the surface that you are lying on. Your upper arm should not move, and your elbow should stay bent. At the top of the movement, your palm should face the floor. Hold for 3 seconds. Slowly return to the starting position and relax your muscles. Repeat for a total of 10 repetitions. Repeat 2 times. Complete this exercise 3 times per week.  Exercise M: Horizontal abduction, standing  Sit on a stable chair, or stand. Secure an exercise band to a stable object in front of you so the band is at shoulder height. Hold one end of the exercise band in each hand. Straighten your elbows and lift your hands straight in front of you, up to shoulder height. Your palms should face down, toward the floor. Step back, away from the secured end of the exercise band, until the band stretches. Move your arms out to your sides, and keep your arms straight. Hold for 3 seconds. Slowly return to the starting position. Repeat for a total of 10 repetitions. Repeat 2 times. Complete this exercise 3 times per week.  Exercise N: Scapular retraction and elevation  Sit on a stable chair, or stand. Secure an exercise band to a stable object in front of you so the band is at shoulder height. Hold one end of the exercise band in each hand. Your palms should face each other. Sit in a stable chair without armrests, or stand. Step back, away from the secured end of the exercise band, until the band stretches. Squeeze your shoulder blades together and lift your hands over your head. Keep your elbows straight. Hold for 3 seconds. Slowly return to the starting position. Repeat for a total of 10 repetitions. Repeat 2 times. Complete this exercise 3 times per week.  This information is not intended to replace advice given to you by your health care provider. Make sure you discuss any questions you have with your health care provider. Document  Released: 07/30/2005 Document Revised: 04/05/2016 Document Reviewed: 06/16/2015 Elsevier Interactive Patient Education  2017 [employer].

## 2023-07-23 ENCOUNTER — Encounter: Payer: Self-pay | Admitting: Family Medicine

## 2023-07-23 ENCOUNTER — Ambulatory Visit (INDEPENDENT_AMBULATORY_CARE_PROVIDER_SITE_OTHER): Payer: PRIVATE HEALTH INSURANCE | Admitting: Family Medicine

## 2023-07-23 VITALS — BP 124/78 | HR 73 | Temp 98.0°F | Resp 16 | Ht 71.0 in | Wt 270.2 lb

## 2023-07-23 DIAGNOSIS — M545 Low back pain, unspecified: Secondary | ICD-10-CM | POA: Diagnosis not present

## 2023-07-23 MED ORDER — TIZANIDINE HCL 4 MG PO TABS: 4.0000 mg | ORAL_TABLET | Freq: Four times a day (QID) | ORAL | 0 refills | Status: DC | PRN

## 2023-07-23 NOTE — Progress Notes (Signed)
Chief Complaint  Patient presents with   Cyst    Discuss cyst on back    Roger Salinas is a 32 y.o. male here for a skin complaint.  Duration: 10 days Location: low back on R Pruritic? No Painful? Yes if he pushes on it or slouches Drainage? No New soaps/lotions/topicals/detergents? No Trauma? No Other associated symptoms: no redness, fevers, changing in size Therapies tried thus far: massage, Advil  Past Medical History:  Diagnosis Date   Essential hypertension 07/03/2017   Obstructive sleep apnea 06/03/2017    BP 124/78 (BP Location: Left Arm, Patient Position: Sitting, Cuff Size: Normal)   Pulse 73   Temp 98 F (36.7 C) (Oral)   Resp 16   Ht 5\' 11"  (1.803 m)   Wt 270 lb 3.2 oz (122.6 kg)   SpO2 98%   BMI 37.69 kg/m  Gen: awake, alert, appearing stated age Lungs: No accessory muscle use Skin: hypertonicity over the right ASIS overlying musculature. No drainage, erythema, TTP, fluctuance, excoriation Psych: Age appropriate judgment and insight  Acute right-sided low back pain without sciatica - Plan: tiZANidine (ZANAFLEX) 4 MG tablet  Skin lesion is actually hypertonicity of musculature.  Heat, ice, Tylenol, Zanaflex as needed, stretches and exercises provided.  Send message if no improvement. F/u prn. The patient voiced understanding and agreement to the plan.  [name] [place], DO 07/23/23 8:11 AM

## 2023-07-23 NOTE — Patient Instructions (Signed)

## 2023-07-25 ENCOUNTER — Other Ambulatory Visit: Payer: Self-pay | Admitting: Family Medicine

## 2023-07-25 DIAGNOSIS — M545 Low back pain, unspecified: Secondary | ICD-10-CM

## 2023-10-24 ENCOUNTER — Encounter: Payer: Self-pay | Admitting: Family Medicine

## 2023-12-18 ENCOUNTER — Encounter: Payer: PRIVATE HEALTH INSURANCE | Admitting: Family Medicine

## 2023-12-23 ENCOUNTER — Ambulatory Visit (INDEPENDENT_AMBULATORY_CARE_PROVIDER_SITE_OTHER): Payer: PRIVATE HEALTH INSURANCE | Admitting: Family Medicine

## 2023-12-23 ENCOUNTER — Encounter: Payer: Self-pay | Admitting: Family Medicine

## 2023-12-23 VITALS — BP 132/84 | HR 94 | Temp 98.0°F | Resp 16 | Ht 71.0 in | Wt 258.0 lb

## 2023-12-23 DIAGNOSIS — Z0000 Encounter for general adult medical examination without abnormal findings: Secondary | ICD-10-CM | POA: Diagnosis not present

## 2023-12-23 NOTE — Progress Notes (Signed)
 Chief Complaint  Patient presents with   Annual Exam    CPE    Well Male Roger Salinas is here for a complete physical.   His last physical was >1 year ago.  Current diet: in general, a "healthy" diet.   Current exercise: walking Weight trend: intentionally decreasing Fatigue out of ordinary? No. Seat belt? Yes.   Advanced directive? No- has form  Health maintenance Tetanus- Yes HIV- Yes Hep C- Yes  Past Medical History:  Diagnosis Date   Essential hypertension 07/03/2017   Obstructive sleep apnea 06/03/2017     Past Surgical History:  Procedure Laterality Date   TONSILLECTOMY      Medications  Current Outpatient Medications on File Prior to Visit  Medication Sig Dispense Refill   cholecalciferol (VITAMIN D) 1000 units tablet Take 1,000 Units by mouth 2 (two) times daily.     Multiple Vitamin (MULTIVITAMIN) tablet Take 1 tablet by mouth daily.     No current facility-administered medications on file prior to visit.     Allergies Allergies  Allergen Reactions   Lisinopril  Cough   Nyquil Hbp Cold & Flu [Dm-Doxylamine-Acetaminophen ] Palpitations    Was sent to ED w/ arrhythmia     Family History Family History  Problem Relation Age of Onset   Hypertension Mother    Diabetes Mother    Diabetes Maternal Uncle    Diabetes Maternal Grandmother    Diabetes Maternal Uncle    Heart attack Neg Hx    Hyperlipidemia Neg Hx    Sudden death Neg Hx     Review of Systems: Constitutional: no fevers or chills Eye:  no recent significant change in vision Ear/Nose/Mouth/Throat:  Ears:  no hearing loss Nose/Mouth/Throat:  no complaints of nasal congestion, no sore throat Cardiovascular:  no chest pain Respiratory:  no shortness of breath Gastrointestinal:  no abdominal pain, no change in bowel habits GU:  Male: negative for dysuria Musculoskeletal/Extremities:  no pain of the joints Integumentary (Skin/Breast):  no abnormal skin lesions reported Neurologic:  no  headaches Endocrine: No unexpected weight changes Hematologic/Lymphatic:  no night sweats  Exam BP 132/84 (BP Location: Left Arm, Patient Position: Sitting)   Pulse 94   Temp 98 F (36.7 C) (Oral)   Resp 16   Ht 5\' 11"  (1.803 m)   Wt 258 lb (117 kg)   SpO2 98%   BMI 35.98 kg/m  General:  well developed, well nourished, in no apparent distress Skin:  no significant moles, warts, or growths Head:  no masses, lesions, or tenderness Eyes:  pupils equal and round, sclera anicteric without injection Ears:  canals without lesions, TMs shiny without retraction, no obvious effusion, no erythema Nose:  nares patent, mucosa normal Throat/Pharynx:  lips and gingiva without lesion; tongue and uvula midline; non-inflamed pharynx; no exudates or postnasal drainage Neck: neck supple without adenopathy, thyromegaly, or masses Lungs:  clear to auscultation, breath sounds equal bilaterally, no respiratory distress Cardio:  regular rate and rhythm, no bruits, no LE edema Abdomen:  abdomen soft, nontender; bowel sounds normal; no masses or organomegaly Genital (male): Deferred Rectal: Deferred Musculoskeletal:  symmetrical muscle groups noted without atrophy or deformity Extremities:  no clubbing, cyanosis, or edema, no deformities, no skin discoloration Neuro:  gait normal; deep tendon reflexes normal and symmetric Psych: well oriented with normal range of affect and appropriate judgment/insight  Assessment and Plan  Well adult exam - Plan: CBC, Comprehensive metabolic panel with GFR, Lipid panel   Well 33 y.o. male. Counseled  on diet and exercise. Self testicular exams recommended at least monthly.  Other orders as above. Advanced directive form requested today.  Follow up in 1 year pending the above workup. The patient voiced understanding and agreement to the plan.  [name] [place], DO 12/23/23 1:29 PM

## 2023-12-23 NOTE — Patient Instructions (Signed)
 Give [place] 2-3 business days to get the results of your labs back.   Keep the diet clean and stay active.  Do monthly self testicular checks in the shower. You are feeling for lumps/bumps that don't belong. If you feel anything like this, let me know!  Please get me a copy of your advanced directive form at your convenience.   Let us know if you need anything.

## 2023-12-24 ENCOUNTER — Ambulatory Visit: Payer: Self-pay | Admitting: Family Medicine

## 2023-12-24 LAB — LIPID PANEL
Cholesterol: 179 mg/dL (ref 0–200)
HDL: 55.6 mg/dL (ref >39.00)
LDL Cholesterol: 94 mg/dL (ref 0–99)
NonHDL: 123.65
Total CHOL/HDL Ratio: 3
Triglycerides: 146 mg/dL (ref 0.0–149.0)
VLDL: 29.2 mg/dL (ref 0.0–40.0)

## 2023-12-24 LAB — CBC
HCT: 48.2 % (ref 39.0–52.0)
Hemoglobin: 16.3 g/dL (ref 13.0–17.0)
MCHC: 33.8 g/dL (ref 30.0–36.0)
MCV: 82.9 fl (ref 78.0–100.0)
Platelets: 215 10*3/uL (ref 150.0–400.0)
RBC: 5.81 Mil/uL (ref 4.22–5.81)
RDW: 13.4 % (ref 11.5–15.5)
WBC: 7.5 10*3/uL (ref 4.0–10.5)

## 2023-12-24 LAB — COMPREHENSIVE METABOLIC PANEL WITH GFR
ALT: 18 U/L (ref 0–53)
AST: 16 U/L (ref 0–37)
Albumin: 4.5 g/dL (ref 3.5–5.2)
Alkaline Phosphatase: 102 U/L (ref 39–117)
BUN: 16 mg/dL (ref 6–23)
CO2: 28 meq/L (ref 19–32)
Calcium: 9.5 mg/dL (ref 8.4–10.5)
Chloride: 103 meq/L (ref 96–112)
Creatinine, Ser: 0.97 mg/dL (ref 0.40–1.50)
GFR: 101.55 mL/min (ref >60.00)
Glucose, Bld: 94 mg/dL (ref 70–99)
Potassium: 4.1 meq/L (ref 3.5–5.1)
Sodium: 138 meq/L (ref 135–145)
Total Bilirubin: 0.8 mg/dL (ref 0.2–1.2)
Total Protein: 7.2 g/dL (ref 6.0–8.3)

## 2024-03-27 ENCOUNTER — Ambulatory Visit (INDEPENDENT_AMBULATORY_CARE_PROVIDER_SITE_OTHER): Payer: Self-pay | Admitting: Family Medicine

## 2024-03-27 ENCOUNTER — Encounter: Payer: Self-pay | Admitting: Family Medicine

## 2024-03-27 VITALS — BP 130/78 | HR 74 | Temp 98.1°F | Resp 18 | Ht 71.0 in | Wt 261.0 lb

## 2024-03-27 DIAGNOSIS — N451 Epididymitis: Secondary | ICD-10-CM

## 2024-03-27 MED ORDER — SULFAMETHOXAZOLE-TRIMETHOPRIM 800-160 MG PO TABS: 1.0000 | ORAL_TABLET | Freq: Two times a day (BID) | ORAL | 0 refills | Status: DC

## 2024-03-27 NOTE — Patient Instructions (Signed)
 Ice/cold pack over area for 10-15 min twice daily.  OK to take Tylenol  1000 mg (2 extra strength tabs) or 975 mg (3 regular strength tabs) every 6 hours as needed.  Let us  know if you need anything.

## 2024-03-27 NOTE — Progress Notes (Signed)
 Chief Complaint  Patient presents with   Testicle Pain    Right onset: 3 days ache/throbbing     Roger Salinas is a 33 y.o. male here for a skin complaint.  Duration: 3 days Location: R testicles Pruritic? No Painful? Yes Drainage? No Urinary complaints? No No new sexual partners.  Trauma? No No insertive anal intercourse.  Other associated symptoms: swelling; no fevers, bruising, redness Therapies tried thus far: none  Past Medical History:  Diagnosis Date   Essential hypertension 07/03/2017   Obstructive sleep apnea 06/03/2017    BP 130/78   Pulse 74   Temp 98.1 F (36.7 C)   Resp 18   Ht 5' 11 (1.803 m)   Wt 261 lb (118.4 kg)   SpO2 98%   BMI 36.40 kg/m  Gen: awake, alert, appearing stated age Lungs: No accessory muscle use Skin: now ext lesions noted. TTP over R epididymis. No drainage, erythema, fluctuance, excoriation Psych: Age appropriate judgment and insight  Epididymitis - Plan: sulfamethoxazole -trimethoprim  (BACTRIM  DS) 800-160 MG tablet  Suspect above. 2 weeks of abx. Let us  know if no resolution.  F/u prn. The patient voiced understanding and agreement to the plan.  Mabel [place], DO 03/27/24 8:41 AM

## 2024-03-29 ENCOUNTER — Encounter: Payer: Self-pay | Admitting: Family Medicine

## 2024-03-30 ENCOUNTER — Other Ambulatory Visit: Payer: Self-pay

## 2024-03-30 ENCOUNTER — Other Ambulatory Visit: Payer: Self-pay | Admitting: Family Medicine

## 2024-03-30 DIAGNOSIS — N50819 Testicular pain, unspecified: Secondary | ICD-10-CM

## 2024-03-30 MED ORDER — LEVOFLOXACIN 500 MG PO TABS: 500.0000 mg | ORAL_TABLET | Freq: Every day | ORAL | 0 refills | Status: AC

## 2024-12-25 ENCOUNTER — Encounter: Admitting: Family Medicine
